# Patient Record
Sex: Female | Born: 1990 | Race: White | Hispanic: No | Marital: Single | Smoking: Never smoker
Health system: Southern US, Community
[De-identification: ages and names within clinical notes are randomized; demographics above are authoritative.]

## PROBLEM LIST (undated history)

## (undated) DIAGNOSIS — F909 Attention-deficit hyperactivity disorder, unspecified type: Secondary | ICD-10-CM

## (undated) DIAGNOSIS — F329 Major depressive disorder, single episode, unspecified: Secondary | ICD-10-CM

## (undated) DIAGNOSIS — F419 Anxiety disorder, unspecified: Secondary | ICD-10-CM

## (undated) DIAGNOSIS — D649 Anemia, unspecified: Secondary | ICD-10-CM

## (undated) DIAGNOSIS — F32A Depression, unspecified: Secondary | ICD-10-CM

## (undated) DIAGNOSIS — I82611 Acute embolism and thrombosis of superficial veins of right upper extremity: Secondary | ICD-10-CM

## (undated) DIAGNOSIS — F840 Autistic disorder: Secondary | ICD-10-CM

## (undated) DIAGNOSIS — R519 Headache, unspecified: Secondary | ICD-10-CM

## (undated) DIAGNOSIS — K219 Gastro-esophageal reflux disease without esophagitis: Secondary | ICD-10-CM

## (undated) DIAGNOSIS — G4730 Sleep apnea, unspecified: Secondary | ICD-10-CM

## (undated) DIAGNOSIS — K209 Esophagitis, unspecified without bleeding: Secondary | ICD-10-CM

## (undated) DIAGNOSIS — E079 Disorder of thyroid, unspecified: Secondary | ICD-10-CM

## (undated) DIAGNOSIS — E039 Hypothyroidism, unspecified: Secondary | ICD-10-CM

## (undated) DIAGNOSIS — J45909 Unspecified asthma, uncomplicated: Secondary | ICD-10-CM

## (undated) DIAGNOSIS — K5190 Ulcerative colitis, unspecified, without complications: Secondary | ICD-10-CM

## (undated) HISTORY — PX: KNEE ARTHROSCOPY: SUR90

## (undated) HISTORY — PX: KNEE SURGERY: SHX244

---
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
## 2011-01-26 ENCOUNTER — Encounter: Payer: Self-pay | Admitting: Emergency Medicine

## 2011-01-26 ENCOUNTER — Inpatient Hospital Stay (INDEPENDENT_AMBULATORY_CARE_PROVIDER_SITE_OTHER)
Admission: RE | Admit: 2011-01-26 | Discharge: 2011-01-26 | Disposition: A | Discharge status: Home / Self Care | Payer: Medicaid Other | Source: Ambulatory Visit | Attending: Emergency Medicine | Admitting: Emergency Medicine

## 2011-01-26 DIAGNOSIS — K5289 Other specified noninfective gastroenteritis and colitis: Secondary | ICD-10-CM

## 2011-01-26 DIAGNOSIS — R112 Nausea with vomiting, unspecified: Secondary | ICD-10-CM

## 2011-01-27 ENCOUNTER — Telehealth (INDEPENDENT_AMBULATORY_CARE_PROVIDER_SITE_OTHER): Payer: Self-pay

## 2011-01-28 ENCOUNTER — Telehealth (INDEPENDENT_AMBULATORY_CARE_PROVIDER_SITE_OTHER): Payer: Self-pay | Admitting: *Deleted

## 2011-02-03 ENCOUNTER — Inpatient Hospital Stay (INDEPENDENT_AMBULATORY_CARE_PROVIDER_SITE_OTHER)
Admission: RE | Admit: 2011-02-03 | Discharge: 2011-02-03 | Disposition: A | Discharge status: Home / Self Care | Payer: Medicaid Other | Source: Ambulatory Visit | Attending: Family Medicine | Admitting: Family Medicine

## 2011-02-03 ENCOUNTER — Encounter: Payer: Self-pay | Admitting: Family Medicine

## 2011-02-03 DIAGNOSIS — J301 Allergic rhinitis due to pollen: Secondary | ICD-10-CM

## 2011-02-03 DIAGNOSIS — M546 Pain in thoracic spine: Secondary | ICD-10-CM

## 2011-02-07 ENCOUNTER — Ambulatory Visit: Payer: Medicaid Other | Attending: Family Medicine | Admitting: Physical Therapy

## 2011-02-07 DIAGNOSIS — M546 Pain in thoracic spine: Secondary | ICD-10-CM | POA: Insufficient documentation

## 2011-02-07 DIAGNOSIS — IMO0001 Reserved for inherently not codable concepts without codable children: Secondary | ICD-10-CM | POA: Insufficient documentation

## 2011-02-07 DIAGNOSIS — M6281 Muscle weakness (generalized): Secondary | ICD-10-CM | POA: Insufficient documentation

## 2011-02-07 DIAGNOSIS — M2569 Stiffness of other specified joint, not elsewhere classified: Secondary | ICD-10-CM | POA: Insufficient documentation

## 2011-02-11 ENCOUNTER — Ambulatory Visit: Payer: Medicaid Other | Admitting: Physical Therapy

## 2011-02-14 ENCOUNTER — Ambulatory Visit: Payer: Medicaid Other | Admitting: Physical Therapy

## 2011-02-19 ENCOUNTER — Encounter: Payer: Medicaid Other | Admitting: Physical Therapy

## 2011-02-21 ENCOUNTER — Encounter: Payer: Medicaid Other | Admitting: Physical Therapy

## 2011-02-22 ENCOUNTER — Ambulatory Visit: Payer: Medicaid Other | Admitting: Physical Therapy

## 2011-03-01 ENCOUNTER — Ambulatory Visit: Payer: Medicaid Other | Attending: Family Medicine | Admitting: Physical Therapy

## 2011-03-01 DIAGNOSIS — M6281 Muscle weakness (generalized): Secondary | ICD-10-CM | POA: Insufficient documentation

## 2011-03-01 DIAGNOSIS — M546 Pain in thoracic spine: Secondary | ICD-10-CM | POA: Insufficient documentation

## 2011-03-01 DIAGNOSIS — M2569 Stiffness of other specified joint, not elsewhere classified: Secondary | ICD-10-CM | POA: Insufficient documentation

## 2011-03-01 DIAGNOSIS — IMO0001 Reserved for inherently not codable concepts without codable children: Secondary | ICD-10-CM | POA: Insufficient documentation

## 2011-03-02 ENCOUNTER — Inpatient Hospital Stay (INDEPENDENT_AMBULATORY_CARE_PROVIDER_SITE_OTHER)
Admission: RE | Admit: 2011-03-02 | Discharge: 2011-03-02 | Disposition: A | Discharge status: Home / Self Care | Payer: Medicaid Other | Source: Ambulatory Visit | Attending: Family Medicine | Admitting: Family Medicine

## 2011-03-02 ENCOUNTER — Encounter: Payer: Self-pay | Admitting: Family Medicine

## 2011-03-02 DIAGNOSIS — R197 Diarrhea, unspecified: Secondary | ICD-10-CM

## 2011-03-02 DIAGNOSIS — M940 Chondrocostal junction syndrome [Tietze]: Secondary | ICD-10-CM

## 2011-03-04 ENCOUNTER — Encounter: Payer: Self-pay | Admitting: Family Medicine

## 2011-03-07 ENCOUNTER — Telehealth (INDEPENDENT_AMBULATORY_CARE_PROVIDER_SITE_OTHER): Payer: Self-pay | Admitting: *Deleted

## 2011-03-08 ENCOUNTER — Other Ambulatory Visit: Payer: Self-pay | Admitting: Family Medicine

## 2011-03-08 ENCOUNTER — Ambulatory Visit
Admission: RE | Admit: 2011-03-08 | Discharge: 2011-03-08 | Disposition: A | Discharge status: Home / Self Care | Payer: Medicaid Other | Source: Ambulatory Visit | Attending: Family Medicine | Admitting: Family Medicine

## 2011-03-08 ENCOUNTER — Inpatient Hospital Stay (INDEPENDENT_AMBULATORY_CARE_PROVIDER_SITE_OTHER)
Admission: RE | Admit: 2011-03-08 | Discharge: 2011-03-08 | Disposition: A | Discharge status: Home / Self Care | Payer: Medicaid Other | Source: Ambulatory Visit | Attending: Family Medicine | Admitting: Family Medicine

## 2011-03-08 ENCOUNTER — Encounter: Payer: Self-pay | Admitting: Family Medicine

## 2011-03-08 DIAGNOSIS — S63509A Unspecified sprain of unspecified wrist, initial encounter: Secondary | ICD-10-CM

## 2011-03-08 DIAGNOSIS — M25539 Pain in unspecified wrist: Secondary | ICD-10-CM

## 2011-03-08 DIAGNOSIS — J029 Acute pharyngitis, unspecified: Secondary | ICD-10-CM

## 2011-03-08 DIAGNOSIS — M940 Chondrocostal junction syndrome [Tietze]: Secondary | ICD-10-CM

## 2011-03-08 DIAGNOSIS — H6690 Otitis media, unspecified, unspecified ear: Secondary | ICD-10-CM

## 2011-03-08 DIAGNOSIS — J069 Acute upper respiratory infection, unspecified: Secondary | ICD-10-CM

## 2011-03-10 ENCOUNTER — Encounter (INDEPENDENT_AMBULATORY_CARE_PROVIDER_SITE_OTHER): Payer: Self-pay

## 2011-03-12 ENCOUNTER — Telehealth (INDEPENDENT_AMBULATORY_CARE_PROVIDER_SITE_OTHER): Payer: Self-pay | Admitting: *Deleted

## 2011-03-20 DIAGNOSIS — E039 Hypothyroidism, unspecified: Secondary | ICD-10-CM | POA: Insufficient documentation

## 2011-03-20 DIAGNOSIS — D649 Anemia, unspecified: Secondary | ICD-10-CM | POA: Insufficient documentation

## 2011-03-22 ENCOUNTER — Inpatient Hospital Stay (INDEPENDENT_AMBULATORY_CARE_PROVIDER_SITE_OTHER)
Admission: RE | Admit: 2011-03-22 | Discharge: 2011-03-22 | Disposition: A | Discharge status: Home / Self Care | Payer: Medicaid Other | Source: Ambulatory Visit | Attending: Family Medicine | Admitting: Family Medicine

## 2011-03-22 ENCOUNTER — Ambulatory Visit
Admission: RE | Admit: 2011-03-22 | Discharge: 2011-03-22 | Disposition: A | Discharge status: Home / Self Care | Payer: Medicaid Other | Source: Ambulatory Visit | Attending: Family Medicine | Admitting: Family Medicine

## 2011-03-22 ENCOUNTER — Other Ambulatory Visit: Payer: Self-pay | Admitting: Family Medicine

## 2011-03-22 ENCOUNTER — Encounter: Payer: Self-pay | Admitting: Family Medicine

## 2011-03-22 DIAGNOSIS — M65839 Other synovitis and tenosynovitis, unspecified forearm: Secondary | ICD-10-CM

## 2011-03-22 DIAGNOSIS — M79609 Pain in unspecified limb: Secondary | ICD-10-CM

## 2011-03-22 DIAGNOSIS — M65849 Other synovitis and tenosynovitis, unspecified hand: Secondary | ICD-10-CM

## 2011-03-24 ENCOUNTER — Encounter: Payer: Self-pay | Admitting: Emergency Medicine

## 2011-03-24 ENCOUNTER — Inpatient Hospital Stay (INDEPENDENT_AMBULATORY_CARE_PROVIDER_SITE_OTHER)
Admission: RE | Admit: 2011-03-24 | Discharge: 2011-03-24 | Disposition: A | Discharge status: Home / Self Care | Payer: Medicaid Other | Source: Ambulatory Visit | Attending: Emergency Medicine | Admitting: Emergency Medicine

## 2011-03-24 DIAGNOSIS — M65839 Other synovitis and tenosynovitis, unspecified forearm: Secondary | ICD-10-CM

## 2011-03-27 ENCOUNTER — Telehealth (INDEPENDENT_AMBULATORY_CARE_PROVIDER_SITE_OTHER): Payer: Self-pay | Admitting: *Deleted

## 2011-04-30 DIAGNOSIS — K52832 Lymphocytic colitis: Secondary | ICD-10-CM | POA: Insufficient documentation

## 2011-07-29 NOTE — Telephone Encounter (Signed)
  Phone Note Call from Patient   Caller: Mom Summary of Call: spoke to pts mom she states that andrya is still having problems with her finger, she saw her PCP today and she refused to give her a referal to a orthopedic per pts mom. she also states that she has been vommiting off and on. Advised the mom to call her case worker tomorrow in regards to changing her PCP. Initial call taken by: Clemens Catholic LPN,  March 27, 2011 7:34 PM

## 2011-07-29 NOTE — Progress Notes (Signed)
Summary: R MIDDLE FINGER PAIN Room 5   Vital Signs:  Patient Profile:   20 Years Old Female CC:      Rt Middle finger pain x 5 yrs hurts worse today Height:     67 inches Weight:      273 pounds O2 Sat:      97 % O2 treatment:    Room Air Temp:     98.2 degrees F oral Pulse rate:   78 / minute Pulse rhythm:   regular Resp:     18 per minute BP sitting:   107 / 73  (left arm) Cuff size:   large  Vitals Entered By: Emilio Math (March 22, 2011 5:21 PM)                  Current Allergies (reviewed today): ! * PRETZEL ! * BEE STINGSHistory of Present Illness Chief Complaint: Rt Middle finger pain x 5 yrs hurts worse today History of Present Illness:  Subjective:  Patient complains of pain in her right 3rd finger for 2 to 3 days.  She has had no trauma to the finger, but admits that she constantly and nervously "cracks my nuckles."  She states that she has had intermittent pain in the finger for about 5 years.  Current Meds INDERAL LA 120 MG XR24H-CAP (PROPRANOLOL HCL) twice daily * GEODON 120 MG CAPS (ZIPRASIDONE HCL) twice daily * SINGULAIR at bedtime * ZYRTEC daily ZOFRAN ODT 4 MG TBDP (ONDANSETRON) 1 dissolvable tab q6 hrs as needed for nausea * DOSTINEX 0.5 MG Mon/Wed/Friday * DEXITANT VIT D2 once a week on Monday  REVIEW OF SYSTEMS Constitutional Symptoms      Denies fever, chills, night sweats, weight loss, weight gain, and fatigue.  Eyes       Denies change in vision, eye pain, eye discharge, glasses, contact lenses, and eye surgery. Ear/Nose/Throat/Mouth       Denies hearing loss/aids, change in hearing, ear pain, ear discharge, dizziness, frequent runny nose, frequent nose bleeds, sinus problems, sore throat, hoarseness, and tooth pain or bleeding.  Respiratory       Denies dry cough, productive cough, wheezing, shortness of breath, asthma, bronchitis, and emphysema/COPD.  Cardiovascular       Denies murmurs, chest pain, and tires easily with exhertion.     Gastrointestinal       Denies stomach pain, nausea/vomiting, diarrhea, constipation, blood in bowel movements, and indigestion. Genitourniary       Denies painful urination, kidney stones, and loss of urinary control. Neurological       Denies paralysis, seizures, and fainting/blackouts. Musculoskeletal       Complains of joint pain, joint stiffness, and decreased range of motion.      Denies muscle pain, redness, swelling, muscle weakness, and gout.  Skin       Denies bruising, unusual mles/lumps or sores, and hair/skin or nail changes.  Psych       Denies mood changes, temper/anger issues, anxiety/stress, speech problems, depression, and sleep problems.  Past History:  Past Medical History: AUTISM ADHD EXPLOSIVE BEHAVIOR Alltices  Past Surgical History: Reviewed history from 02/03/2011 and no changes required. 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 GRANDFATER- CA,DM Family History High cholesterol-Mother Bi-polar1&2-Mother  Social History: Reviewed history from 03/02/2011 and no changes required. SMOKE-NO ALCOHOL-NO REC. DRUGS-NO Lives with Mom   Objective:  Right third finger:  Patient is holding finger in slight hyperextension at the PIP joint.  Mild swelling at the PIP joint.  Finger has full range of motion.  Mild tenderness at PIP and DIP joints.  Distal neurovascular intact  X-ray right third finger:  Negative Assessment New Problems: OTHER TENOSYNOVITIS OF HAND AND WRIST (ICD-727.05) FINGER PAIN (ICD-729.5)  MILD TENDONTITIS RIGHT THIRD FINGER FROM EXCESSIVE MANIPULATION OF JOINTS.   WILL TREAT AS A SPRAIN  Plan New Orders: T-DG Finger Middle*R* [73140] Application finger Splint [29130] Upper Limb Ortosis (wrist loop, arm band, finger splint) [L3999] Services provided After hours-Weekends-Holidays [99051] Est. Patient Level III [04540] Planning Comments:   Splint applied with finger  in mild flexion; wear for about 5 days.  Apply ice pack several times daily.  Begin Ibuprofen. In about 5 days begin range of motion exercises ([employer] information and instruction patient handout given for finger sprain)  Advised to avoid "cracking nuckles" Followup with [facility] if not improved in two weeks.   The patient and/or caregiver has been counseled thoroughly with regard to medications prescribed including dosage, schedule, interactions, rationale for use, and possible side effects and they verbalize understanding.  Diagnoses and expected course of recovery discussed and will return if not improved as expected or if the condition worsens. Patient and/or caregiver verbalized understanding.   Orders Added: 1)  T-DG Finger Middle*R* [73140] 2)  Application finger Splint [29130] 3)  Upper Limb Ortosis (wrist loop, arm band, finger splint) [L3999] 4)  Services provided After hours-Weekends-Holidays [99051] 5)  Est. Patient Level III [98119]

## 2011-07-29 NOTE — Telephone Encounter (Signed)
  Phone Note Call from Patient   Caller: Patient mother [name] Summary of Call: Patient's mother called requesting throat culture result. Negative result given. She also inquired about drinking milk. Per Dr. Rolla Plate note from 03/08/11 he recommends she still stay away from milk products. I relayed this information to Chara's mother.  Initial call taken by: Lajean Saver RN,  March 12, 2011 2:42 PM

## 2011-07-29 NOTE — Telephone Encounter (Signed)
  Phone Note Outgoing Call Call back at [phone]   Call placed by: Lajean Saver RN,  March 07, 2011 2:27 PM Call placed to: Patient & patient mother Action Taken: Phone Call Completed Summary of Call: Callback: Patients mother reports Jasmine Hickman is still having some diarrhea. She is being seen @ her PCP office today for ear infections. O &P and lactoferrin result given to mother. Told her stool cutlure is still pending. Dr. Cathren Harsh will call her tomorrow if further testing or medications need to be ordered  when results come back.  Initial call taken by: Lajean Saver RN,  March 07, 2011 2:29 PM

## 2011-07-29 NOTE — Progress Notes (Signed)
Summary: DIARRHEA (room 2)   Vital Signs:  Patient Profile:   20 Years Old Female CC:      diarrhea Height:     67 inches Weight:      275 pounds O2 Sat:      100 % O2 treatment:    Room Air Temp:     97.8 degrees F oral Pulse rate:   67 / minute Resp:     16 per minute BP sitting:   107 / 72  (left arm) Cuff size:   large  Pt. in pain?   no  Vitals Entered By: Lavell Islam RN (March 02, 2011 12:09 PM)                   Current Allergies (reviewed today): ! * PRETZEL ! * BEE STINGSHistory of Present Illness Chief Complaint: diarrhea History of Present Illness:  Subjective:  Mom reports that Taleshia has had diarrhea for about 3 weeks.  There was some blood in her stool initially, now resolved, but diarrhea persists (about 3 to 4 stools per day).  No abdominal pain.  No fever.  No recent foreign travel or drinking untreated water.  No recent antibiotics.  Mom reports that Alexya drinks large amounts of milk.  She also complains of chest pain recently.  REVIEW OF SYSTEMS Constitutional Symptoms       Complains of fever.     Denies chills, night sweats, weight loss, weight gain, and fatigue.  Eyes       Denies change in vision, eye pain, eye discharge, glasses, contact lenses, and eye surgery. Ear/Nose/Throat/Mouth       Complains of ear pain.      Denies hearing loss/aids, change in hearing, ear discharge, dizziness, frequent runny nose, frequent nose bleeds, sinus problems, sore throat, hoarseness, and tooth pain or bleeding.  Respiratory       Denies dry cough, productive cough, wheezing, shortness of breath, asthma, bronchitis, and emphysema/COPD.  Cardiovascular       Denies murmurs, chest pain, and tires easily with exhertion.    Gastrointestinal       Complains of diarrhea.      Denies stomach pain, nausea/vomiting, constipation, blood in bowel movements, and indigestion. Genitourniary       Denies painful urination, kidney stones, and loss of urinary  control. Neurological       Denies paralysis, seizures, and fainting/blackouts. Musculoskeletal       Denies muscle pain, joint pain, joint stiffness, decreased range of motion, redness, swelling, muscle weakness, and gout.  Skin       Denies bruising, unusual mles/lumps or sores, and hair/skin or nail changes.  Psych       Denies mood changes, temper/anger issues, anxiety/stress, speech problems, depression, and sleep problems. Other Comments: diarrhea    Past History:  Past Medical History: Reviewed history from 01/26/2011 and no changes required. AUTISM ADHD EXPLOSIVE BEHAVIOR  Family History: Reviewed history from 01/26/2011 and no changes required. GRANDFATER- CA,DM Family History High cholesterol Bi-polar  Social History: Reviewed history from 01/26/2011 and no changes required. SMOKE-NO ALCOHOL-NO REC. DRUGS-NO Lives with Mom   Objective:  No acute distress.  She appears comfortable and alert. Eyes:  Pupils are equal, round, and reactive to light and accomodation.  Extraocular movement is intact.  Conjunctivae are not inflamed.  Mouth/pharynx:  normal Neck:  Supple.  No adenopathy is present.  No thyromegaly is present  Lungs:  Clear to auscultation.  Breath sounds are  equal.  Chest:  Distinct tenderness over the sternum Heart:  Regular rate and rhythm without murmurs, rubs, or gallops.  Abdomen:  Nontender without masses or hepatosplenomegaly.  Bowel sounds are present.  No CVA or flank tenderness.  Extremities:  No edema.  No calf tenderness Skin:  No rash EKG:  No acute changes. Assessment New Problems: COSTOCHONDRITIS, ACUTE (ICD-733.6) DIARRHEA (ICD-787.91)  ? LACTOSE INTOLERANCE  Plan New Orders: Services provided After hours-Weekends-Holidays [99051] T-Culture, Stool [[id number]] T-Stool for O&P [87177-70555] Stool, WBC/Lactoferrin-[facility] [83630] Est. Patient Level IV [21308] Planning Comments:   Recommend Ibuprofen 200mg , 4 tabs every 8  hours with food for chest tenderness.  Apply heat pad to chest several times daily.  Given a Water quality scientist patient information and instruction sheet on topic costochondritis Requested to return stool specimens for culture, exam for O & P, and WBC/lactoferrin. Recommend that she discontinue milk products for about 2 to 3 weeks.  If symptoms persist, recommend GI evaluation.   The patient and/or caregiver has been counseled thoroughly with regard to medications prescribed including dosage, schedule, interactions, rationale for use, and possible side effects and they verbalize understanding.  Diagnoses and expected course of recovery discussed and will return if not improved as expected or if the condition worsens. Patient and/or caregiver verbalized understanding.   Orders Added: 1)  Services provided After hours-Weekends-Holidays [99051] 2)  T-Culture, Stool [[id number]] 3)  T-Stool for O&P [[id number]] 4)  Stool, WBC/Lactoferrin-[facility] [83630] 5)  Est. Patient Level IV [65784]

## 2011-07-29 NOTE — Progress Notes (Signed)
Summary: sore throat   Vital Signs:  Patient Profile:   20 Years Old Female CC:      bILATERAL EAR PAIN; SORE THROAT;  LEFT WRIST HURTS Height:     67 inches Weight:      274.75 pounds O2 Sat:      97 % O2 treatment:    Room Air Temp:     98.1 degrees F oral Pulse rate:   85 / minute Resp:     16 per minute BP sitting:   105 / 73  (left arm) Cuff size:   large  Vitals Entered By: Lavell Islam RN (March 08, 2011 3:24 PM)                  Updated Prior Medication List: INDERAL LA 120 MG XR24H-CAP (PROPRANOLOL HCL) twice daily * GEODON 120 MG CAPS (ZIPRASIDONE HCL) twice daily * SINGULAIR at bedtime * ZYRTEC daily ZOFRAN ODT 4 MG TBDP (ONDANSETRON) 1 dissolvable tab q6 hrs as needed for nausea * DOSTINEX 0.5 MG Mon/Wed/Friday * DEXITANT VIT D2 once a week on Monday  Current Allergies (reviewed today): ! * PRETZEL ! * BEE STINGSHistory of Present Illness Chief Complaint: bILATERAL EAR PAIN; SORE THROAT;  LEFT WRIST HURTS History of Present Illness:  Subjective: Patient presents with two problems: 1)  She has had URI symptoms that started yesterday. 2)  She hyperflexed her left wrist on bathtub this morning and felt a popping sensation.  She has had persistent pain in left wrist today. + sore throat + cough No pleuritic pain but continues to have anterior chest discomfort No wheezing + nasal congestion ? post-nasal drainage No sinus pain/pressure No itchy/red eyes + earache No hemoptysis No SOB No fever/chills No nausea No vomiting No abdominal pain No diarrhea No skin rashes + fatigue No myalgias No headache Used OTC meds without relief   REVIEW OF SYSTEMS Constitutional Symptoms       Complains of fever.     Denies chills, night sweats, weight loss, weight gain, and fatigue.  Eyes       Denies change in vision, eye pain, eye discharge, glasses, contact lenses, and eye surgery. Ear/Nose/Throat/Mouth       Complains of ear pain, frequent runny nose,  sinus problems, and sore throat.      Denies hearing loss/aids, change in hearing, ear discharge, dizziness, frequent nose bleeds, hoarseness, and tooth pain or bleeding.  Respiratory       Complains of dry cough.      Denies productive cough, wheezing, shortness of breath, asthma, bronchitis, and emphysema/COPD.  Cardiovascular       Denies murmurs, chest pain, and tires easily with exhertion.    Gastrointestinal       Complains of diarrhea.      Denies stomach pain, nausea/vomiting, constipation, blood in bowel movements, and indigestion. Genitourniary       Complains of loss of urinary control.      Denies painful urination and kidney stones. Neurological       Complains of headaches, numbness, and tingling.      Denies paralysis, seizures, and fainting/blackouts. Musculoskeletal       Complains of joint pain and joint stiffness.      Denies muscle pain, decreased range of motion, redness, swelling, muscle weakness, and gout.  Skin       Denies bruising, unusual mles/lumps or sores, and hair/skin or nail changes.  Psych       Denies mood changes, temper/anger  issues, anxiety/stress, speech problems, depression, and sleep problems. Other Comments: URI S&S; sore throat; continued diarrhea/GI; left wrist pain   Past History:  Family History: Last updated: 03/02/2011 [facility]- [place],DM Family History High cholesterol Bi-polar  Social History: Last updated: 03/02/2011 SMOKE-NO ALCOHOL-NO REC. DRUGS-NO Lives with Mom  Past Medical History: Reviewed history from 01/26/2011 and no changes required. AUTISM ADHD EXPLOSIVE BEHAVIOR  Past Surgical History: Reviewed history from 02/03/2011 and no changes required. 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 GRANDFATER- CA,DM Family History High cholesterol Bi-polar  Social History: Reviewed history from 03/02/2011 and no changes required. SMOKE-NO ALCOHOL-NO REC.  DRUGS-NO Lives with Mom   Objective:  No acute distress  Eyes:  Pupils are equal, round, and reactive to light and accomodation.  Extraocular movement is intact.  Conjunctivae are not inflamed.  Ears:  Canals normal.  Tympanic membranes are erythematous bilaterally, worse on the left Nose:  Mildly congested turbinates.  No sinus tenderness  Pharynx:  Erythematous Neck:  Supple.  No adenopathy is present.   Lungs:  Clear to auscultation.  Breath sounds are equal.  Chest:  Tenderness over sternum Heart:  Regular rate and rhythm without murmurs, rubs, or gallops.  Abdomen:  Nontender without masses or hepatosplenomegaly.  Bowel sounds are present.  No CVA or flank tenderness.  Extremities:  No edema.   Skin:  No rash Left wrist:  No swelling or deformity.  Good range of motion.  Tender dorsally over distal radius/ulna.  Distal neurovascular intact. X-ray left wrist:  Negative Rapid strep test negative  Assessment  Assessed COSTOCHONDRITIS, ACUTE as unchanged - Donna Christen MD New Problems: WRIST SPRAIN, LEFT (ICD-842.00) WRIST PAIN, LEFT (ICD-719.43) ACUTE PHARYNGITIS (ICD-462) OTITIS MEDIA, ACUTE, BILATERAL (ICD-382.9) UPPER RESPIRATORY INFECTION, ACUTE (ICD-465.9)  NEW ONSET VIRAL URI WITH BILATERAL OTITIS MEDIA.  Plan New Medications/Changes: BENZONATATE 200 MG CAPS (BENZONATATE) One by mouth hs as needed cough  #12 x 0, 03/08/2011, Donna Christen MD AMOXICILLIN 875 MG TABS (AMOXICILLIN) One by mouth two times a day  #20 x 0, 03/08/2011, Donna Christen MD  New Orders: T-Culture, Throat [[id number]] T-DG Wrist Complete*L* [73110] Rapid Strep [[address]] Pulse Oximetry [94760] Wrist & Forearm Splint any size [L3984] Est. Patient Level V [91478] Planning Comments:   Begin amoxicillin, expectorant, topical decongestant, cough suppressant at bedtime.  Increase fluid intake Throat culture pending. Dispensed wrist splint, apply ice pack to wrist.  Begin wrist exercises in 3 to 5  days ([employer] information and instruction patient handout given).  Continue heat pad to sternum several times dailyl. Followup with PCP if not improving 7 to 10 days   The patient and/or caregiver has been counseled thoroughly with regard to medications prescribed including dosage, schedule, interactions, rationale for use, and possible side effects and they verbalize understanding.  Diagnoses and expected course of recovery discussed and will return if not improved as expected or if the condition worsens. Patient and/or caregiver verbalized understanding.  Prescriptions: BENZONATATE 200 MG CAPS (BENZONATATE) One by mouth hs as needed cough  #12 x 0   Entered and Authorized by:   Donna Christen MD   Signed by:   Donna Christen MD on 03/08/2011   Method used:   Print then Give to Patient   RxID:   [id number] AMOXICILLIN 875 MG TABS (AMOXICILLIN) One by mouth two times a day  #20 x 0   Entered and Authorized by:   Donna Christen MD   Signed by:   Jeannett Senior  Beese MD on 03/08/2011   Method used:   Print then Give to Patient   RxID:   [id number]   Patient Instructions: 1)  Take Mucinex (guaifenesin) twice daily for congestion. 2)  Increase fluid intake, rest. 3)  May use Afrin nasal spray (or generic oxymetazoline) twice daily for about 5 days.  Also recommend using saline nasal spray several times daily and/or saline nasal irrigation. 4)  Stop Zyrtec for now. 5)  Followup with family doctor if not improving 7 to 10 days.   Orders Added: 1)  T-Culture, Throat [[id number]] 2)  T-DG Wrist Complete*L* [73110] 3)  Rapid Strep [87880] 4)  Pulse Oximetry [94760] 5)  Wrist & Forearm Splint any size [L3984] 6)  Est. Patient Level V [84132]  Appended Document: sore throat Rapid Strep: Negative

## 2011-07-29 NOTE — Telephone Encounter (Signed)
  Phone Note Outgoing Call   Call placed by: Linton Flemings RN,  January 27, 2011 12:32 PM Call placed to: Patient Summary of Call: message left informing that we were calling to follow up with status and to call facility with questions/concerns

## 2011-07-29 NOTE — Progress Notes (Signed)
Summary: pt reuest ear medication  pt. family called and states that pt is unable to sleep d/t ear pain. pt has been given ABT but they are requesting ear gtts for pain.  Lucky Cowboy Yetta Barre RN  March 10, 2011 3:19 PM  pt request to have medication sent [facility] [address] in [facility] RN  March 10, 2011 3:21 PM  Plan:  ANTIPYRINE-BENZOCAINE 5.4-1.4 % SOLN (BENZOCAINE-ANTIPYRINE) 2 to 4 gtts in affected ear three times a day for 2 to 3 days as needed ear pain Donna Christen MD  March 10, 2011 3:27 PM   Prescriptions: ANTIPYRINE-BENZOCAINE 5.4-1.4 % SOLN (BENZOCAINE-ANTIPYRINE) 2 to 4 gtts in affected ear three times a day for 2 to 3 days as needed ear pain  #10cc x 0   Entered and Authorized by:   Donna Christen MD   Signed by:   Donna Christen MD on 03/10/2011   Method used:   [facility] to        [facility] [phone][facility] (retail)       [address] [place], [place]  [address]       Ph: [phone] or [phone]       Fax: [phone]   RxID:   [id number]  Called Patrica family member and informed her that medication was sent electronically. Lucky Cowboy Yetta Barre RN  March 10, 2011 3:32 PM

## 2011-07-29 NOTE — Telephone Encounter (Signed)
  Phone Note Outgoing Call Call back at [phone]   Call placed by: Lajean Saver RN,  January 28, 2011 8:38 AM Call placed to: Patient mother Summary of Call: Received message from mother from Sunday that Jasmine Hickman was still vomiting after attempting to several different foods. Diarrhea has stopped but vomitting, dizzy and HA. I returned the call but got her mother's voicemail. Left a messgae reporting Dr. Donnamarie Poag note for rest, hydration, BLAND diet and if worsening then will needs labs and further testing @ the ER. Call back with questions. Initial call taken by: Lajean Saver RN,  January 28, 2011 8:43 AM    Lajean Saver RN  January 28, 2011 8:43 AM

## 2011-07-29 NOTE — Progress Notes (Signed)
Summary: F/U splint on finger/TM(RM5)   Vital Signs:  Patient Profile:   20 Years Old Female CC:      RE- SPLINT FINGER Height:     67 inches Weight:      272 pounds O2 Sat:      96 % Temp:     98.1 degrees F oral Pulse rate:   79 / minute Resp:     16 per minute BP sitting:   105 / 74  Vitals Entered By: Linton Flemings RN (March 24, 2011 4:41 PM)                  Updated Prior Medication List: INDERAL LA 120 MG XR24H-CAP (PROPRANOLOL HCL) twice daily * GEODON 120 MG CAPS (ZIPRASIDONE HCL) twice daily * SINGULAIR at bedtime * ZYRTEC daily ZOFRAN ODT 4 MG TBDP (ONDANSETRON) 1 dissolvable tab q6 hrs as needed for nausea * DOSTINEX 0.5 MG Mon/Wed/Friday * DEXITANT VIT D2 once a week on Monday  Current Allergies (reviewed today): ! * PRETZEL ! * BEE STINGSHistory of Present Illness Chief Complaint: RE- SPLINT FINGER History of Present Illness: Nursing visit due to callous on finger and need for a new splint since the current one is aggravating her finger.  Seen by nurse, not the physician.  REVIEW OF SYSTEMS Constitutional Symptoms      Denies fever, chills, night sweats, weight loss, weight gain, and fatigue.  Eyes       Denies change in vision, eye pain, eye discharge, glasses, contact lenses, and eye surgery. Ear/Nose/Throat/Mouth       Denies hearing loss/aids, change in hearing, ear pain, ear discharge, dizziness, frequent runny nose, frequent nose bleeds, sinus problems, sore throat, hoarseness, and tooth pain or bleeding.  Respiratory       Denies dry cough, productive cough, wheezing, shortness of breath, asthma, bronchitis, and emphysema/COPD.  Cardiovascular       Denies murmurs, chest pain, and tires easily with exhertion.    Gastrointestinal       Denies stomach pain, nausea/vomiting, diarrhea, constipation, blood in bowel movements, and indigestion. Genitourniary       Denies painful urination, kidney stones, and loss of urinary control. Neurological   Denies paralysis, seizures, and fainting/blackouts. Musculoskeletal       Denies muscle pain, joint pain, joint stiffness, decreased range of motion, redness, swelling, muscle weakness, and gout.  Skin       Denies bruising, unusual mles/lumps or sores, and hair/skin or nail changes.  Psych       Denies mood changes, temper/anger issues, anxiety/stress, speech problems, depression, and sleep problems. Other Comments: SMALL CALLOUS ON FINGER, STATES SPLINT WAS TO TIGHT. RE- SPLINT FINGER PT STATES IT FEELS BETTER   Past History:  Past Medical History: Reviewed history from 03/22/2011 and no changes required. AUTISM ADHD EXPLOSIVE BEHAVIOR Alltices  Past Surgical History: Reviewed history from 02/03/2011 and no changes required. 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 GRANDFATER- CA,DM Family History High cholesterol-Mother Bi-polar1&2-Mother  Social History: Reviewed history from 03/02/2011 and no changes required. SMOKE-NO ALCOHOL-NO REC. DRUGS-NO Lives with Mom  Plan New Orders: No Charge Patient Arrived (NCPA0) [NCPA0] Planning Comments:   see previous note for details.  new stack splint given which the patient prefers over the other one.   The patient and/or caregiver has been counseled thoroughly with regard to medications prescribed including dosage, schedule, interactions, rationale for use, and possible side effects and they verbalize understanding.  Diagnoses and expected course of recovery discussed and will return if not improved as expected or if the condition worsens. Patient and/or caregiver verbalized understanding.   Orders Added: 1)  No Charge Patient Arrived (NCPA0) [NCPA0]

## 2011-07-29 NOTE — Progress Notes (Signed)
Summary: back pain/TM (room 4)   Vital Signs:  Patient Profile:   20 Years Old Female CC:      Back Pain x 6 days Height:     67 inches Weight:      272 pounds O2 Sat:      98 % O2 treatment:    Room Air Temp:     98.5 degrees F oral Pulse rate:   102 / minute Resp:     18 per minute BP sitting:   97 / 68  (right arm) Cuff size:   large  Pt. in pain?   yes    Location:   back  Vitals Entered By: Lavell Islam RN (February 03, 2011 3:20 PM)                   Prior Medication List:  INDERAL LA 120 MG XR24H-CAP (PROPRANOLOL HCL) twice daily * GEODON 120 MG CAPS (ZIPRASIDONE HCL) twice daily * SINGULAIR at bedtime * ZYRTEC daily ZOFRAN ODT 4 MG TBDP (ONDANSETRON) 1 dissolvable tab q6 hrs as needed for nausea   Current Allergies (reviewed today): ! * PRETZEL ! * BEE STINGSHistory of Present Illness Chief Complaint: Back Pain x 6 days History of Present Illness:  Subjective:  Patient complains of one week history of vague pain in upper back around shoulder blades,  now radiating somewhat to lower back.  Pain is worse with activity and movement.  No cough or shortness of breath.  She recalls no injury or recent change in activities. She also complains of soreness beneath her left eye.  Her mother states that she has seasonal allergies but has not taken her allergy meds recently.  No sore throat or earache.  REVIEW OF SYSTEMS Constitutional Symptoms       Complains of fever, chills, and night sweats.     Denies weight loss, weight gain, and fatigue.  Eyes       Complains of eye pain.      Denies change in vision, eye discharge, glasses, contact lenses, and eye surgery. Ear/Nose/Throat/Mouth       Complains of ear pain and sinus problems.      Denies hearing loss/aids, change in hearing, ear discharge, dizziness, frequent runny nose, frequent nose bleeds, sore throat, hoarseness, and tooth pain or bleeding.  Respiratory       Complains of shortness of breath.      Denies dry  cough, productive cough, wheezing, asthma, bronchitis, and emphysema/COPD.  Cardiovascular       Complains of chest pain.      Denies murmurs and tires easily with exhertion.    Gastrointestinal       Complains of nausea/vomiting and constipation.      Denies stomach pain, diarrhea, blood in bowel movements, and indigestion. Genitourniary       Complains of loss of urinary control.      Denies painful urination and kidney stones. Neurological       Denies paralysis, seizures, and fainting/blackouts. Musculoskeletal       Denies muscle pain, joint pain, joint stiffness, decreased range of motion, redness, swelling, muscle weakness, and gout.  Skin       Complains of bruising.      Denies unusual mles/lumps or sores and hair/skin or nail changes.      Comments: easily Psych       Complains of depression.      Denies mood changes, temper/anger issues, anxiety/stress, speech problems, and sleep problems. Other  Comments: Here for back pain x 6 days   Past History:  Family History: Last updated: 01/26/2011 [facility]- [place],DM  Social History: Last updated: 01/26/2011 SMOKE-NO ALCOHOL-NO REC. DRUGS-NO  Past Medical History: Reviewed history from 01/26/2011 and no changes required. 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 GRANDFATER- CA,DM  Social History: Reviewed history from 01/26/2011 and no changes required. SMOKE-NO ALCOHOL-NO REC. DRUGS-NO   Objective:  Appearance:  Patient is obese, otherwise in no acute distress  Eyes:  Pupils are equal, round, and reactive to light and accomodation.  Extraocular movement is intact.  Conjunctivae are not inflamed.  Ears:  Canals normal.  Tympanic membranes normal.   Nose:  Mildly congested turbinates.  Mild tenderness over left maxillary sinus Pharynx:  Normal  Neck:  Supple.  No adenopathy is present.  No  thyromegaly is present  Lungs:  Clear to auscultation.  Breath sounds are equal.  Heart:  Regular rate and rhythm without murmurs, rubs, or gallops.  Abdomen:  Nontender without masses or hepatosplenomegaly.  Bowel sounds are present.  No CVA or flank tenderness.  Back:  Distinct tenderness along medial and inferior edges of both scapula.  Pain is accentuated by resisted abduction of the shoulders.  Minimal tenderness below the scapulae. Skin:  No rash. Assessment New Problems: ALLERGIC RHINITIS, SEASONAL (ICD-477.0) BACK PAIN, THORACIC REGION (ICD-724.1)  BILATERAL RHOMBOID MUSCLE INFLAMMATION. MILD SINUS CONGESTION  Plan New Orders: Physical Therapy Referral [PT] Est. Patient Level IV [11914] Services provided After hours-Weekends-Holidays [99051] Planning Comments:   Apply heating pad to tender areas.  May take Tylenol at bedtime for pain.  Begin range of motion and stretching exercises for rhomboid muscles ([employer] information and instruction patient handout given).  Refer to PT Recommend resuming allergy meds, including fluticasone nasal spray.  Begin Mucinex and increased fluids.   The patient and/or caregiver has been counseled thoroughly with regard to medications prescribed including dosage, schedule, interactions, rationale for use, and possible side effects and they verbalize understanding.  Diagnoses and expected course of recovery discussed and will return if not improved as expected or if the condition worsens. Patient and/or caregiver verbalized understanding.   Orders Added: 1)  Physical Therapy Referral [PT] 2)  Est. Patient Level IV [78295] 3)  Services provided After hours-Weekends-Holidays [99051]

## 2011-07-29 NOTE — Progress Notes (Signed)
Summary: Followup Call  Followup call to mom.  She states that Jasmine Hickman was improving while off milk, but had a milkshake at her grandparents, and diarrhea recurred.  She has had no fever or blood in stool. Discussed lab results (negative cultures, O & P, and positive fecal lactoferrin).  Recommend that she remain off milk products, and follow-up with a gastroenterologist (may call us for a referral). Donna Christen MD  March 08, 2011 9:51 AM

## 2011-07-29 NOTE — Progress Notes (Signed)
Summary: STOMACH ISSUES(RM3)   Vital Signs:  Patient Profile:   20 Years Old Female CC:      upset stoamch Height:     67 inches Weight:      272 pounds O2 Sat:      97 % O2 treatment:    Room Air Temp:     97.4 degrees F oral Pulse rate:   84 / minute Resp:     20 per minute BP supine:   113 / 80  (left arm) BP sitting:   106 / 81  (left arm) BP standing:   113 / 84  (left arm) Cuff size:   large  Vitals Entered By: Linton Flemings RN (January 26, 2011 1:27 PM)                  Updated Prior Medication List: INDERAL LA 120 MG XR24H-CAP (PROPRANOLOL HCL) twice daily * GEODON 120 MG CAPS (ZIPRASIDONE HCL) twice daily * SINGULAIR at bedtime * ZYRTEC daily  Current Allergies: ! * PRETZEL ! * BEE STINGSHistory of Present Illness History from: patient Chief Complaint: upset stoamch History of Present Illness: N/V/D for 2 weeks, not improving.  She went to ER 2 days ago, they told her to take Imodium.  Her D is improving, but still with N/V and some P.O intolerance.  Generalized abdominal pain as well, HA, fatigue, and malaise.  +Sick contacts.  She tried to eat an egg sandwich this morning but threw it up.  REVIEW OF SYSTEMS Constitutional Symptoms       Complains of fever.     Denies chills, night sweats, weight loss, weight gain, and fatigue.  Eyes       Complains of change in vision and glasses.      Denies eye pain, eye discharge, contact lenses, and eye surgery. Ear/Nose/Throat/Mouth       Complains of ear pain.      Denies hearing loss/aids, change in hearing, ear discharge, dizziness, frequent runny nose, frequent nose bleeds, sinus problems, sore throat, hoarseness, and tooth pain or bleeding.  Respiratory       Denies dry cough, productive cough, wheezing, shortness of breath, asthma, bronchitis, and emphysema/COPD.  Cardiovascular       Denies murmurs, chest pain, and tires easily with exhertion.    Gastrointestinal       Denies stomach pain, nausea/vomiting,  diarrhea, constipation, blood in bowel movements, and indigestion. Genitourniary       Denies painful urination, kidney stones, and loss of urinary control. Neurological       Denies paralysis, seizures, and fainting/blackouts. Musculoskeletal       Denies muscle pain, joint pain, joint stiffness, decreased range of motion, redness, swelling, muscle weakness, and gout.  Skin       Denies bruising, unusual mles/lumps or sores, and hair/skin or nail changes.  Psych       Denies mood changes, temper/anger issues, anxiety/stress, speech problems, depression, and sleep problems. Other Comments: PT STATES SHE HAS BEEN SICK ON HER STOMACH SINCE LAST WEEK   Past History:  Past Medical History: AUTISM ADHD EXPLOSIVE BEHAVIOR  Family History: GRANDFATER- CA,DM  Social History: SMOKE-NO ALCOHOL-NO REC. DRUGS-NO Physical Exam General appearance: well developed, well nourished, no acute distress Ears: normal, no lesions or deformities Nasal: mucosa pink, nonedematous, no septal deviation, turbinates normal Oral/Pharynx: tongue normal, posterior pharynx without erythema or exudate Chest/Lungs: no rales, wheezes, or rhonchi bilateral, breath sounds equal without effort Heart: regular rate and  rhythm, no murmur Abdomen: mild diffuse tenderness, abdomen soft without obvious organomegaly Skin: no obvious rashes or lesions MSE: oriented to time, place, and person Assessment New Problems: NAUSEA AND VOMITING (ICD-787.01) GASTROENTERITIS (ICD-558.9)  Patient vomited  ~200cc after approx 20 mins of IVF (this was after 3x episodes).  Then gave her IM Phenergan and continued the IVF.  Upon leaving, she seems like she is more energetic and feels better.    Plan New Medications/Changes: ZOFRAN ODT 4 MG TBDP (ONDANSETRON) 1 dissolvable tab q6 hrs as needed for nausea  #16 x 0, 01/26/2011, Hoyt Koch MD  New Orders: New Patient Level IV [phone] Zofran 4mg  Tab [EMRORAL] IV Fluids 1st  hr.- Non Medicare [96360] Promethazine up to 50mg  [J2550] Admin of Therapeutic Inj  intramuscular or subcutaneous [96372] Planning Comments:   Gave 1L IVF and P.O Zofran.  Then IM Phenergan.  Rx given for ODT Zofran tabs to take over the next few days.  Educated on bland diet, and gradual resumption of regular diet over 3-5 days.  If further issues, should follow up with your PCP or at the ER if much worse, unable to keep any fluids/foods down.  LIkely just a viral gastroenteritis and resultant mild dehydration secondary to her learning difficulties and inability to fully comprehend the need for bland diet, smaller portions, etc.  Mother is present, understands, and agrees.  I advise that she go home, rest, eat small portions of a bland diet.  If still not able to keep stuff down or further problems, will need labwork at the ER as well as probably further evaluation.   The patient and/or caregiver has been counseled thoroughly with regard to medications prescribed including dosage, schedule, interactions, rationale for use, and possible side effects and they verbalize understanding.  Diagnoses and expected course of recovery discussed and will return if not improved as expected or if the condition worsens. Patient and/or caregiver verbalized understanding.  Prescriptions: ZOFRAN ODT 4 MG TBDP (ONDANSETRON) 1 dissolvable tab q6 hrs as needed for nausea  #16 x 0   Entered and Authorized by:   Hoyt Koch MD   Signed by:   Hoyt Koch MD on 01/26/2011   Method used:   Print then Give to Patient   RxID:   [id number]   Medication Administration  Injection # 1:    Medication: Promethazine up to 50mg     Diagnosis: NAUSEA AND VOMITING (ICD-787.01)    Route: IM    Site: RUOQ gluteus    Exp Date: 07/26/2013    Lot #: [id number]    Mfr: [place]    Comments: 25mg  IM    Patient tolerated injection without complications    Given by: Linton Flemings RN (January 26, 2011 3:20 PM)  Orders  Added: 1)  New Patient Level IV [99204] 2)  Zofran 4mg  Tab [EMRORAL] 3)  IV Fluids 1st hr.- Non Medicare [96360] 4)  Promethazine up to 50mg  [J2550] 5)  Admin of Therapeutic Inj  intramuscular or subcutaneous [96372]     Appended Document: STOMACH ISSUES(RM3) IV to left AC with 20g needle, infused of NS. w/out redness or infiltration noted. d/c IV  after liter of NS, cath intact. pt tolerated well.

## 2012-09-10 DIAGNOSIS — F71 Moderate intellectual disabilities: Secondary | ICD-10-CM | POA: Insufficient documentation

## 2012-12-02 ENCOUNTER — Ambulatory Visit (HOSPITAL_BASED_OUTPATIENT_CLINIC_OR_DEPARTMENT_OTHER): Admit: 2012-12-02 | Payer: Medicaid Other

## 2012-12-02 ENCOUNTER — Emergency Department (HOSPITAL_BASED_OUTPATIENT_CLINIC_OR_DEPARTMENT_OTHER): Payer: Medicare Other

## 2012-12-02 ENCOUNTER — Encounter (HOSPITAL_BASED_OUTPATIENT_CLINIC_OR_DEPARTMENT_OTHER): Payer: Self-pay | Admitting: *Deleted

## 2012-12-02 ENCOUNTER — Emergency Department (HOSPITAL_BASED_OUTPATIENT_CLINIC_OR_DEPARTMENT_OTHER)
Admission: EM | Admit: 2012-12-02 | Discharge: 2012-12-02 | Disposition: A | Discharge status: Home / Self Care | Payer: Medicare Other | Attending: Emergency Medicine | Admitting: Emergency Medicine

## 2012-12-02 DIAGNOSIS — L03119 Cellulitis of unspecified part of limb: Secondary | ICD-10-CM | POA: Insufficient documentation

## 2012-12-02 DIAGNOSIS — M25569 Pain in unspecified knee: Secondary | ICD-10-CM | POA: Diagnosis present

## 2012-12-02 DIAGNOSIS — L02419 Cutaneous abscess of limb, unspecified: Secondary | ICD-10-CM | POA: Insufficient documentation

## 2012-12-02 DIAGNOSIS — L0390 Cellulitis, unspecified: Secondary | ICD-10-CM

## 2012-12-02 DIAGNOSIS — G8918 Other acute postprocedural pain: Secondary | ICD-10-CM | POA: Insufficient documentation

## 2012-12-02 HISTORY — DX: Autistic disorder: F84.0

## 2012-12-02 HISTORY — DX: Unspecified asthma, uncomplicated: J45.909

## 2012-12-02 LAB — D-DIMER, QUANTITATIVE: D-Dimer, Quant: 0.64 ug/mL-FEU — ABNORMAL HIGH (ref 0.00–0.48)

## 2012-12-02 MED ORDER — TRAMADOL HCL 50 MG PO TABS: 50.0000 mg | ORAL_TABLET | Freq: Four times a day (QID) | ORAL | Status: DC | PRN

## 2012-12-02 MED ORDER — SULFAMETHOXAZOLE-TRIMETHOPRIM 800-160 MG PO TABS: 1.0000 | ORAL_TABLET | Freq: Two times a day (BID) | ORAL | Status: DC

## 2012-12-02 NOTE — ED Notes (Signed)
MD at bedside. 

## 2012-12-02 NOTE — ED Notes (Signed)
Patient transported to X-ray 

## 2012-12-02 NOTE — ED Provider Notes (Signed)
History     CSN: [id number]  Arrival date & time 12/02/12  0127   None     Chief Complaint  Patient presents with  . Knee Pain    (Consider location/radiation/quality/duration/timing/severity/associated sxs/prior treatment) HPI Comments: Patient presents to the ER for evaluation of right knee pain. Patient reports that she had arthroscopic knee surgery at [facility] 3 weeks ago. She has had some slight drainage from one of the surgery sites since the surgery. She was seen in the office for followup and told that it was normal. Patient reports last night she had increased pain in the knee area. She reports the pain is severe he tries to move the knee.  Patient is a 22 y.o. female presenting with knee pain.  Knee Pain   No past medical history on file.  History reviewed. No pertinent past surgical history.  No family history on file.  History  Substance Use Topics  . Smoking status: Not on file  . Smokeless tobacco: Not on file  . Alcohol Use: Not on file      Review of Systems  Musculoskeletal: Positive for arthralgias.  Skin: Positive for wound.  All other systems reviewed and are negative.    Allergies  Bee venom  Home Medications  No current outpatient prescriptions on file.  There were no vitals taken for this visit.  Physical Exam  Constitutional: He is oriented to person, place, and time. He appears well-developed and well-nourished. No distress.  HENT:  Head: Normocephalic and atraumatic.  Right Ear: Hearing normal.  Nose: Nose normal.  Mouth/Throat: Oropharynx is clear and moist and mucous membranes are normal.  Eyes: Conjunctivae and EOM are normal. Pupils are equal, round, and reactive to light.  Neck: Normal range of motion. Neck supple.  Cardiovascular: Normal rate, regular rhythm, S1 normal and S2 normal.  Exam reveals no gallop and no friction rub.   No murmur heard. Pulmonary/Chest: Effort normal and breath sounds normal. No  respiratory distress. He exhibits no tenderness.  Abdominal: Soft. Normal appearance and bowel sounds are normal. There is no hepatosplenomegaly. There is no tenderness. There is no rebound, no guarding, no tenderness at McBurney's point and negative Murphy's sign. No hernia.  Musculoskeletal:       Right knee: He exhibits decreased range of motion. He exhibits no swelling, no effusion, no ecchymosis, no deformity and no laceration. Tenderness found.  Neurological: He is alert and oriented to person, place, and time. He has normal strength. No cranial nerve deficit or sensory deficit. Coordination normal. GCS eye subscore is 4. GCS verbal subscore is 5. GCS motor subscore is 6.  Skin: Skin is warm, dry and intact. No rash noted. No cyanosis.     Psychiatric: He has a normal mood and affect. His speech is normal and behavior is normal. Thought content normal.    ED Course  Procedures (including critical care time)  Labs Reviewed - No data to display No results found.   Diagnosis: Postoperative pain, possible cellulitis    MDM  Patient presents to the ER with complaints of right knee pain. She did have surgery on the knee 3 weeks ago. One of the trocar sites is draining serosanguineous fluid but has some surrounding erythema. Can't rule out infection. Will treat with Bactrim. X-ray does not show any large fluid collections or other abnormalities. No sign of Osteomyelitis.  Patient does not have any significant calf swelling and Homans sign was negative. Because of recent surgery, however, DVT is  considered. D-dimer was slightly elevated, cannot rule out DVT. I do feel that it is unlikely, will schedule for ultrasound tomorrow.        Gilda Crease, MD 12/02/12 [phone]

## 2012-12-02 NOTE — ED Notes (Signed)
Right knee pain x 24 hours

## 2012-12-07 ENCOUNTER — Encounter (HOSPITAL_BASED_OUTPATIENT_CLINIC_OR_DEPARTMENT_OTHER): Payer: Self-pay

## 2012-12-07 ENCOUNTER — Emergency Department (HOSPITAL_BASED_OUTPATIENT_CLINIC_OR_DEPARTMENT_OTHER)
Admission: EM | Admit: 2012-12-07 | Discharge: 2012-12-07 | Disposition: A | Discharge status: Home / Self Care | Payer: Medicaid Other | Attending: Emergency Medicine | Admitting: Emergency Medicine

## 2012-12-07 DIAGNOSIS — R1110 Vomiting, unspecified: Secondary | ICD-10-CM

## 2012-12-07 DIAGNOSIS — R42 Dizziness and giddiness: Secondary | ICD-10-CM | POA: Insufficient documentation

## 2012-12-07 DIAGNOSIS — N390 Urinary tract infection, site not specified: Secondary | ICD-10-CM | POA: Insufficient documentation

## 2012-12-07 DIAGNOSIS — F840 Autistic disorder: Secondary | ICD-10-CM | POA: Insufficient documentation

## 2012-12-07 DIAGNOSIS — J45909 Unspecified asthma, uncomplicated: Secondary | ICD-10-CM | POA: Insufficient documentation

## 2012-12-07 DIAGNOSIS — R112 Nausea with vomiting, unspecified: Secondary | ICD-10-CM | POA: Insufficient documentation

## 2012-12-07 LAB — COMPREHENSIVE METABOLIC PANEL
ALT: 16 U/L (ref 0–35)
AST: 13 U/L (ref 0–37)
Alkaline Phosphatase: 32 U/L — ABNORMAL LOW (ref 39–117)
Calcium: 9.8 mg/dL (ref 8.4–10.5)
GFR calc Af Amer: >90 mL/min (ref >90)
Glucose, Bld: 99 mg/dL (ref 70–99)
Potassium: 3.6 mEq/L (ref 3.5–5.1)
Sodium: 141 mEq/L (ref 135–145)
Total Protein: 7.1 g/dL (ref 6.0–8.3)

## 2012-12-07 LAB — URINALYSIS, ROUTINE W REFLEX MICROSCOPIC
Bilirubin Urine: NEGATIVE
Glucose, UA: NEGATIVE mg/dL
Hgb urine dipstick: NEGATIVE
Specific Gravity, Urine: 1.014 (ref 1.005–1.030)
pH: 7 (ref 5.0–8.0)

## 2012-12-07 LAB — CBC WITH DIFFERENTIAL/PLATELET
Basophils Absolute: 0 10*3/uL (ref 0.0–0.1)
Eosinophils Absolute: 0.1 10*3/uL (ref 0.0–0.7)
Lymphocytes Relative: 33 % (ref 12–46)
Lymphs Abs: 2.4 10*3/uL (ref 0.7–4.0)
MCH: 30 pg (ref 26.0–34.0)
Neutrophils Relative %: 58 % (ref 43–77)
Platelets: 232 10*3/uL (ref 150–400)
RBC: 4.07 MIL/uL (ref 3.87–5.11)
RDW: 12.7 % (ref 11.5–15.5)
WBC: 7.3 10*3/uL (ref 4.0–10.5)

## 2012-12-07 LAB — URINE MICROSCOPIC-ADD ON

## 2012-12-07 LAB — PREGNANCY, URINE: Preg Test, Ur: NEGATIVE

## 2012-12-07 MED ORDER — MECLIZINE HCL 25 MG PO TABS
25.0000 mg | ORAL_TABLET | Freq: Once | ORAL | Status: AC
  Administered 2012-12-07: 25 mg via ORAL
  Filled 2012-12-07: qty 1

## 2012-12-07 MED ORDER — SODIUM CHLORIDE 0.9 % IV BOLUS (SEPSIS)
1000.0000 mL | Freq: Once | INTRAVENOUS | Status: AC
  Administered 2012-12-07: 1000 mL via INTRAVENOUS

## 2012-12-07 MED ORDER — LORAZEPAM 2 MG/ML IJ SOLN
1.0000 mg | Freq: Once | INTRAMUSCULAR | Status: AC
  Administered 2012-12-07: 1 mg via INTRAMUSCULAR
  Filled 2012-12-07: qty 1

## 2012-12-07 MED ORDER — METOCLOPRAMIDE HCL 5 MG/ML IJ SOLN
10.0000 mg | Freq: Once | INTRAMUSCULAR | Status: AC
  Administered 2012-12-07: 10 mg via INTRAMUSCULAR
  Filled 2012-12-07: qty 2

## 2012-12-07 MED ORDER — SODIUM CHLORIDE 0.9 % IV SOLN: INTRAVENOUS | Status: DC

## 2012-12-07 MED ORDER — ONDANSETRON 8 MG PO TBDP
8.0000 mg | ORAL_TABLET | Freq: Once | ORAL | Status: AC
  Administered 2012-12-07: 8 mg via ORAL
  Filled 2012-12-07: qty 1

## 2012-12-07 MED ORDER — METOCLOPRAMIDE HCL 10 MG PO TABS: 10.0000 mg | ORAL_TABLET | Freq: Four times a day (QID) | ORAL | Status: DC | PRN

## 2012-12-07 MED ORDER — LORAZEPAM 2 MG/ML IJ SOLN
1.0000 mg | Freq: Once | INTRAMUSCULAR | Status: AC
  Administered 2012-12-07: 1 mg via INTRAVENOUS
  Filled 2012-12-07: qty 1

## 2012-12-07 MED ORDER — SULFAMETHOXAZOLE-TRIMETHOPRIM 800-160 MG PO TABS: 1.0000 | ORAL_TABLET | Freq: Two times a day (BID) | ORAL | Status: DC

## 2012-12-07 MED ORDER — DIPHENHYDRAMINE HCL 50 MG/ML IJ SOLN
25.0000 mg | Freq: Once | INTRAMUSCULAR | Status: AC
  Administered 2012-12-07: 25 mg via INTRAMUSCULAR
  Filled 2012-12-07: qty 1

## 2012-12-07 NOTE — ED Notes (Signed)
Pt appears to have spit a moderate amount of sputum in the emesis bag.  Pt is talkative and appears to be in no apparent distress, conversing with mother and watching television.

## 2012-12-07 NOTE — ED Provider Notes (Signed)
History     CSN: [id number]  Arrival date & time 12/07/12  6213   First MD Initiated Contact with Patient 12/07/12 0710      Chief Complaint  Patient presents with  . Dizziness  . Nausea    (Consider location/radiation/quality/duration/timing/severity/associated sxs/prior treatment) The history is provided by the patient.   Patient here complaining of dizziness which started prior to arrival. History of similar symptoms associated vertigo and this feels the same. Did have one emesis. No fever or chills. No severe headaches. No unilateral weakness. Symptoms worse with certain movements of her head and better with remaining still. Past Medical History  Diagnosis Date  . Autism   . Asthma     History reviewed. No pertinent past surgical history.  History reviewed. No pertinent family history.  History  Substance Use Topics  . Smoking status: Never Smoker   . Smokeless tobacco: Never Used  . Alcohol Use: No    OB History   Grav Para Term Preterm Abortions TAB SAB Ect Mult Living                  Review of Systems  All other systems reviewed and are negative.    Allergies  Bee venom and Other  Home Medications   Current Outpatient Rx  Name  Route  Sig  Dispense  Refill  . Alum & Mag Hydroxide-Simeth (GI COCKTAIL) SUSP suspension   Oral   Take 30 mLs by mouth as needed for indigestion. Shake well.         Marland Kitchen HYDROcodone-acetaminophen (NORCO/VICODIN) 5-325 MG per tablet   Oral   Take 1 tablet by mouth every 6 (six) hours as needed for pain.         Marland Kitchen sulfamethoxazole-trimethoprim (SEPTRA DS) 800-160 MG per tablet   Oral   Take 1 tablet by mouth every 12 (twelve) hours.   10 tablet   0   . traMADol (ULTRAM) 50 MG tablet   Oral   Take 1 tablet (50 mg total) by mouth every 6 (six) hours as needed for pain.   15 tablet   0     BP 101/65  Pulse 92  Temp(Src) 97.5 F (36.4 C) (Oral)  Resp 20  Ht 5\' 7"  (1.702 m)  Wt 301 lb (136.533 kg)  BMI  47.13 kg/m2  SpO2 98%  Physical Exam  Nursing note and vitals reviewed. Constitutional: She is oriented to person, place, and time. She appears well-developed and well-nourished.  Non-toxic appearance. No distress.  HENT:  Head: Normocephalic and atraumatic.  Eyes: Conjunctivae, EOM and lids are normal. Pupils are equal, round, and reactive to light.  Neck: Normal range of motion. Neck supple. No tracheal deviation present. No mass present.  Cardiovascular: Normal rate, regular rhythm and normal heart sounds.  Exam reveals no gallop.   No murmur heard. Pulmonary/Chest: Effort normal and breath sounds normal. No stridor. No respiratory distress. She has no decreased breath sounds. She has no wheezes. She has no rhonchi. She has no rales.  Abdominal: Soft. Normal appearance and bowel sounds are normal. She exhibits no distension. There is no tenderness. There is no rebound and no CVA tenderness.  Musculoskeletal: Normal range of motion. She exhibits no edema and no tenderness.  Neurological: She is alert and oriented to person, place, and time. She has normal strength. No cranial nerve deficit or sensory deficit. GCS eye subscore is 4. GCS verbal subscore is 5. GCS motor subscore is 6.  Nystagmus noted  Skin: Skin is warm and dry. No abrasion and no rash noted.  Psychiatric: She has a normal mood and affect. Her speech is normal and behavior is normal.    ED Course  Procedures (including critical care time)  Labs Reviewed - No data to display No results found.   No diagnosis found.    MDM  Patient given medications for vertigo. Remains with some vomiting. Denies abdominal pain but does note possible bad food exposure. Her mother was with the patient also notes similar symptoms.       Patient to be given medications for this  10:42 AM Pt rechecked and feels better--will tx for uti, no evidnece of pyelo  Toy Baker, MD 12/07/12 1042

## 2012-12-07 NOTE — ED Notes (Signed)
Pt presents today c/o dizzness and nausea, onset yeseterday.  No vomiting as of yet.  Phenergan and zofran not relieving symptoms.

## 2012-12-07 NOTE — ED Notes (Signed)
Mother states that the patient is throwing up.  MD notified.

## 2012-12-08 ENCOUNTER — Ambulatory Visit (HOSPITAL_BASED_OUTPATIENT_CLINIC_OR_DEPARTMENT_OTHER): Payer: Medicaid Other

## 2012-12-09 LAB — URINE CULTURE

## 2012-12-24 ENCOUNTER — Encounter (HOSPITAL_BASED_OUTPATIENT_CLINIC_OR_DEPARTMENT_OTHER): Payer: Self-pay

## 2012-12-24 ENCOUNTER — Emergency Department (HOSPITAL_BASED_OUTPATIENT_CLINIC_OR_DEPARTMENT_OTHER)
Admission: EM | Admit: 2012-12-24 | Discharge: 2012-12-24 | Disposition: A | Discharge status: Home / Self Care | Payer: Medicaid Other | Attending: Emergency Medicine | Admitting: Emergency Medicine

## 2012-12-24 ENCOUNTER — Emergency Department (HOSPITAL_BASED_OUTPATIENT_CLINIC_OR_DEPARTMENT_OTHER): Payer: Medicaid Other

## 2012-12-24 DIAGNOSIS — Z8659 Personal history of other mental and behavioral disorders: Secondary | ICD-10-CM | POA: Insufficient documentation

## 2012-12-24 DIAGNOSIS — S8990XA Unspecified injury of unspecified lower leg, initial encounter: Secondary | ICD-10-CM | POA: Insufficient documentation

## 2012-12-24 DIAGNOSIS — R296 Repeated falls: Secondary | ICD-10-CM | POA: Insufficient documentation

## 2012-12-24 DIAGNOSIS — Y9301 Activity, walking, marching and hiking: Secondary | ICD-10-CM | POA: Insufficient documentation

## 2012-12-24 DIAGNOSIS — M25561 Pain in right knee: Secondary | ICD-10-CM

## 2012-12-24 DIAGNOSIS — J45909 Unspecified asthma, uncomplicated: Secondary | ICD-10-CM | POA: Insufficient documentation

## 2012-12-24 DIAGNOSIS — Y92009 Unspecified place in unspecified non-institutional (private) residence as the place of occurrence of the external cause: Secondary | ICD-10-CM | POA: Insufficient documentation

## 2012-12-24 NOTE — ED Notes (Signed)
MD at bedside. 

## 2012-12-24 NOTE — ED Notes (Signed)
Fell yesterday-pain to right knee

## 2012-12-24 NOTE — ED Provider Notes (Signed)
History     CSN: [id number]  Arrival date & time 12/24/12  2104   First MD Initiated Contact with Patient 12/24/12 2319      Chief Complaint  Patient presents with  . Fall    (Consider location/radiation/quality/duration/timing/severity/associated sxs/prior treatment) HPI Pt presenting with c/o pain in right knee after fall yesterday.  She was walking in the driveway and carrying her cat when she fell forward onto her right knee.  Has been able to bear weight.  Over one month ago had arthroscopy of this knee, afterward had joint washout for infection.  This has been healing well.  Pain increased after fall yesterday.  No fever/chills, no redness of joint.    Past Medical History  Diagnosis Date  . Autism   . Asthma   . Mental retardation     History reviewed. No pertinent past surgical history.  No family history on file.  History  Substance Use Topics  . Smoking status: Never Smoker   . Smokeless tobacco: Never Used  . Alcohol Use: No    OB History   Grav Para Term Preterm Abortions TAB SAB Ect Mult Living                  Review of Systems ROS reviewed and all otherwise negative except for mentioned in HPI  Allergies  Bee venom and Other  Home Medications   Current Outpatient Rx  Name  Route  Sig  Dispense  Refill  . Alum & Mag Hydroxide-Simeth (GI COCKTAIL) SUSP suspension   Oral   Take 30 mLs by mouth as needed for indigestion. Shake well.         Marland Kitchen HYDROcodone-acetaminophen (NORCO/VICODIN) 5-325 MG per tablet   Oral   Take 1 tablet by mouth every 6 (six) hours as needed for pain.         Marland Kitchen metoCLOPramide (REGLAN) 10 MG tablet   Oral   Take 1 tablet (10 mg total) by mouth every 6 (six) hours as needed (nausea).   12 tablet   0   . ondansetron (ZOFRAN-ODT) 4 MG disintegrating tablet   Oral   Take 4 mg by mouth every 8 (eight) hours as needed for nausea.         . promethazine (PHENERGAN) 25 MG tablet   Oral   Take 25 mg by mouth every 6  (six) hours as needed for nausea.         Marland Kitchen sulfamethoxazole-trimethoprim (SEPTRA DS) 800-160 MG per tablet   Oral   Take 1 tablet by mouth every 12 (twelve) hours.   10 tablet   0   . sulfamethoxazole-trimethoprim (SEPTRA DS) 800-160 MG per tablet   Oral   Take 1 tablet by mouth every 12 (twelve) hours.   10 tablet   0   . traMADol (ULTRAM) 50 MG tablet   Oral   Take 1 tablet (50 mg total) by mouth every 6 (six) hours as needed for pain.   15 tablet   0     BP 108/86  Pulse 90  Temp(Src) 98.7 F (37.1 C) (Oral)  Resp 20  SpO2 99% Vitals reviewed Physical Exam Physical Examination: General appearance - alert, well appearing, and in no distress Mental status - alert, oriented to person, place, and time Eyes - no conjunctival injection, no scleral icterus Neurological - alert, oriented, right lower extrmity distally NVI Musculoskeletal - no joint tenderness, deformity or swelling Extremities - peripheral pulses normal, no pedal edema, no  clubbing or cyanosis Skin - normal coloration and turgor except for approx 3cm diameter bruise over left knee, arthroscopy incision sites well healing, c/d/i, no rashes  ED Course  Procedures (including critical care time)  Labs Reviewed - No data to display Dg Knee Complete 4 Views Right  12/24/2012  *RADIOLOGY REPORT*  Clinical Data: Right knee pain after fall today.  Bruising. History of patellar surgery on 11/05/2012.  RIGHT KNEE - COMPLETE 4+ VIEW  Comparison: 12/02/2012  Findings: Small effusion is suggested.  Right knee appears otherwise intact. No evidence of acute fracture or subluxation.  No focal bone lesions.  Bone matrix and cortex appear intact.  No abnormal radiopaque densities in the soft tissues.  IMPRESSION: Small right knee effusion.  No acute fractures demonstrated.   Original Report Authenticated By: Burman Nieves, M.D.      1. Knee pain, right       MDM  Pt with bruising and mild joint effusion after fall  yesterday.  She has had recent arthroscopy and joint washout- no sign of infection now.  Pt already has knee immoblizer, crutches at home advised to use these again and to call her orthopedic doctor in [place] for recheck.  She is taking hydrocodone.  Discharged with strict return precautions.  Pt agreeable with plan.        Ethelda Chick, MD 12/25/12 [phone]

## 2013-01-12 ENCOUNTER — Encounter (HOSPITAL_BASED_OUTPATIENT_CLINIC_OR_DEPARTMENT_OTHER): Payer: Self-pay | Admitting: Emergency Medicine

## 2013-01-12 ENCOUNTER — Emergency Department (HOSPITAL_BASED_OUTPATIENT_CLINIC_OR_DEPARTMENT_OTHER)
Admission: EM | Admit: 2013-01-12 | Discharge: 2013-01-12 | Disposition: A | Discharge status: Home / Self Care | Payer: Medicaid Other | Attending: Emergency Medicine | Admitting: Emergency Medicine

## 2013-01-12 DIAGNOSIS — Z8659 Personal history of other mental and behavioral disorders: Secondary | ICD-10-CM | POA: Insufficient documentation

## 2013-01-12 DIAGNOSIS — F840 Autistic disorder: Secondary | ICD-10-CM | POA: Insufficient documentation

## 2013-01-12 DIAGNOSIS — J45909 Unspecified asthma, uncomplicated: Secondary | ICD-10-CM | POA: Insufficient documentation

## 2013-01-12 DIAGNOSIS — B372 Candidiasis of skin and nail: Secondary | ICD-10-CM | POA: Insufficient documentation

## 2013-01-12 MED ORDER — NYSTATIN 100000 UNIT/GM EX POWD
CUTANEOUS | Status: AC
  Filled 2013-01-12: qty 15

## 2013-01-12 NOTE — ED Provider Notes (Signed)
History     CSN: [id number]  Arrival date & time 01/12/13  0444   First MD Initiated Contact with Patient 01/12/13 0502      Chief Complaint  Patient presents with  . Rash    (Consider location/radiation/quality/duration/timing/severity/associated sxs/prior treatment) HPI Rash on abdomen for unknown period of time.  On nystantin cream and powder but continues to irritae.  Seen at pmd office yesterday but didn't want female provider to see.  No spreading redness in intertiginous area.  No fever or chills.  Past Medical History  Diagnosis Date  . Autism   . Asthma   . Mental retardation     History reviewed. No pertinent past surgical history.  History reviewed. No pertinent family history.  History  Substance Use Topics  . Smoking status: Never Smoker   . Smokeless tobacco: Never Used  . Alcohol Use: No    OB History   Grav Para Term Preterm Abortions TAB SAB Ect Mult Living                  Review of Systems  All other systems reviewed and are negative.    Allergies  Bee venom and Other  Home Medications   Current Outpatient Rx  Name  Route  Sig  Dispense  Refill  . metoCLOPramide (REGLAN) 10 MG tablet   Oral   Take 1 tablet (10 mg total) by mouth every 6 (six) hours as needed (nausea).   12 tablet   0   . traMADol (ULTRAM) 50 MG tablet   Oral   Take 1 tablet (50 mg total) by mouth every 6 (six) hours as needed for pain.   15 tablet   0   . Alum & Mag Hydroxide-Simeth (GI COCKTAIL) SUSP suspension   Oral   Take 30 mLs by mouth as needed for indigestion. Shake well.         Marland Kitchen HYDROcodone-acetaminophen (NORCO/VICODIN) 5-325 MG per tablet   Oral   Take 1 tablet by mouth every 6 (six) hours as needed for pain.         Marland Kitchen ondansetron (ZOFRAN-ODT) 4 MG disintegrating tablet   Oral   Take 4 mg by mouth every 8 (eight) hours as needed for nausea.         . promethazine (PHENERGAN) 25 MG tablet   Oral   Take 25 mg by mouth every 6 (six) hours  as needed for nausea.         Marland Kitchen sulfamethoxazole-trimethoprim (SEPTRA DS) 800-160 MG per tablet   Oral   Take 1 tablet by mouth every 12 (twelve) hours.   10 tablet   0   . sulfamethoxazole-trimethoprim (SEPTRA DS) 800-160 MG per tablet   Oral   Take 1 tablet by mouth every 12 (twelve) hours.   10 tablet   0     BP 145/82  Pulse 64  Temp(Src) 98.1 F (36.7 C) (Oral)  Resp 20  SpO2 99%  Physical Exam  Nursing note and vitals reviewed. Constitutional: She is oriented to person, place, and time. She appears well-developed and well-nourished.  Morbidly obese   HENT:  Right Ear: External ear normal.  Eyes: Pupils are equal, round, and reactive to light.  Neck: Normal range of motion.  Cardiovascular: Normal rate and regular rhythm.   Pulmonary/Chest: Effort normal and breath sounds normal.  Abdominal: Soft. Bowel sounds are normal.  Neurological: She is alert and oriented to person, place, and time.  Skin: Skin is warm  and dry.  ertyhematous area between skin folds on abdomen   Psychiatric: She has a normal mood and affect.    ED Course  Procedures (including critical care time)  Labs Reviewed - No data to display No results found.   No diagnosis found.    MDM        Hilario Quarry, MD 01/12/13 [phone]

## 2013-01-12 NOTE — ED Notes (Signed)
Pt reports rash between abdominal folds started 4/24, saw pcp, started on nystatin cream and powder, daily after bathing and patting area dry,

## 2013-02-05 ENCOUNTER — Emergency Department (HOSPITAL_BASED_OUTPATIENT_CLINIC_OR_DEPARTMENT_OTHER)
Admission: EM | Admit: 2013-02-05 | Discharge: 2013-02-05 | Disposition: A | Discharge status: Home / Self Care | Payer: 59 | Attending: Emergency Medicine | Admitting: Emergency Medicine

## 2013-02-05 ENCOUNTER — Encounter (HOSPITAL_BASED_OUTPATIENT_CLINIC_OR_DEPARTMENT_OTHER): Payer: Self-pay | Admitting: *Deleted

## 2013-02-05 DIAGNOSIS — J45909 Unspecified asthma, uncomplicated: Secondary | ICD-10-CM | POA: Insufficient documentation

## 2013-02-05 DIAGNOSIS — K219 Gastro-esophageal reflux disease without esophagitis: Secondary | ICD-10-CM

## 2013-02-05 DIAGNOSIS — R112 Nausea with vomiting, unspecified: Secondary | ICD-10-CM | POA: Insufficient documentation

## 2013-02-05 DIAGNOSIS — Z79899 Other long term (current) drug therapy: Secondary | ICD-10-CM | POA: Insufficient documentation

## 2013-02-05 DIAGNOSIS — F79 Unspecified intellectual disabilities: Secondary | ICD-10-CM | POA: Insufficient documentation

## 2013-02-05 DIAGNOSIS — K5190 Ulcerative colitis, unspecified, without complications: Secondary | ICD-10-CM | POA: Insufficient documentation

## 2013-02-05 DIAGNOSIS — F840 Autistic disorder: Secondary | ICD-10-CM | POA: Insufficient documentation

## 2013-02-05 DIAGNOSIS — Z8719 Personal history of other diseases of the digestive system: Secondary | ICD-10-CM | POA: Insufficient documentation

## 2013-02-05 DIAGNOSIS — R079 Chest pain, unspecified: Secondary | ICD-10-CM | POA: Insufficient documentation

## 2013-02-05 DIAGNOSIS — Z792 Long term (current) use of antibiotics: Secondary | ICD-10-CM | POA: Insufficient documentation

## 2013-02-05 DIAGNOSIS — IMO0002 Reserved for concepts with insufficient information to code with codable children: Secondary | ICD-10-CM | POA: Insufficient documentation

## 2013-02-05 HISTORY — DX: Ulcerative colitis, unspecified, without complications: K51.90

## 2013-02-05 MED ORDER — METOCLOPRAMIDE HCL 10 MG PO TABS: 10.0000 mg | ORAL_TABLET | Freq: Three times a day (TID) | ORAL | Status: DC | PRN

## 2013-02-05 MED ORDER — METOCLOPRAMIDE HCL 5 MG/ML IJ SOLN
10.0000 mg | Freq: Once | INTRAMUSCULAR | Status: AC
  Administered 2013-02-05: 10 mg via INTRAVENOUS
  Filled 2013-02-05: qty 2

## 2013-02-05 MED ORDER — PANTOPRAZOLE SODIUM 40 MG IV SOLR
40.0000 mg | Freq: Once | INTRAVENOUS | Status: AC
  Administered 2013-02-05: 40 mg via INTRAVENOUS
  Filled 2013-02-05: qty 40

## 2013-02-05 MED ORDER — SODIUM CHLORIDE 0.9 % IV BOLUS (SEPSIS)
500.0000 mL | Freq: Once | INTRAVENOUS | Status: AC
  Administered 2013-02-05: 500 mL via INTRAVENOUS

## 2013-02-05 MED ORDER — GI COCKTAIL ~~LOC~~
30.0000 mL | Freq: Once | ORAL | Status: AC
  Administered 2013-02-05: 30 mL via ORAL
  Filled 2013-02-05: qty 30

## 2013-02-05 MED ORDER — GI COCKTAIL ~~LOC~~: 30.0000 mL | Freq: Three times a day (TID) | ORAL | Status: DC | PRN

## 2013-02-05 NOTE — ED Notes (Signed)
Pt has esophagitis and has had abdominal pain and chest pain on and off for 1 month. Pt began vomiting yesterday and her GI cocktail is no longer helping.

## 2013-02-05 NOTE — ED Provider Notes (Signed)
History     CSN: [id number]  Arrival date & time 02/05/13  0206   First MD Initiated Contact with Patient 02/05/13 [phone]      Chief Complaint  Patient presents with  . Abdominal Pain    (Consider location/radiation/quality/duration/timing/severity/associated sxs/prior treatment) HPI Is a 22 year old female with history of ulcerative colitis. She also has a history of esophagitis. She is here with nausea, vomiting, epigastric and substernal pain that began yesterday evening. She is equivocal about whether this pain is like that of previous esophagitis. She has had similar episodes in the past. She is not currently on an acid blocker but is on an antacid/simethicone suspension; she states it is not helping. She states Zofran and Phenergan are not helping her nausea and is requesting Reglan. Her pain is not severe at this time. Her bowel movements have been normal for her without blood or other evidence of acute exacerbation of her ulcerative colitis.  Past Medical History  Diagnosis Date  . Autism   . Asthma   . Mental retardation     History reviewed. No pertinent past surgical history.  No family history on file.  History  Substance Use Topics  . Smoking status: Never Smoker   . Smokeless tobacco: Never Used  . Alcohol Use: No    OB History   Grav Para Term Preterm Abortions TAB SAB Ect Mult Living                  Review of Systems  All other systems reviewed and are negative.    Allergies  Bee venom and Other  Home Medications   Current Outpatient Rx  Name  Route  Sig  Dispense  Refill  . amoxicillin (AMOXIL) 500 MG tablet   Oral   Take 1,000 mg by mouth 2 (two) times daily.         Marland Kitchen levocetirizine (XYZAL) 5 MG tablet   Oral   Take 5 mg by mouth every evening.         Marland Kitchen levothyroxine (SYNTHROID, LEVOTHROID) 88 MCG tablet   Oral   Take 88 mcg by mouth daily before breakfast.         . mesalamine (APRISO) 0.375 G 24 hr capsule   Oral   Take  375 mg by mouth 4 (four) times daily.         . montelukast (SINGULAIR) 10 MG tablet   Oral   Take 10 mg by mouth at bedtime.         . sucralfate (CARAFATE) 1 G tablet   Oral   Take 1 g by mouth 4 (four) times daily.         . ziprasidone (GEODON) 60 MG capsule   Oral   Take 60 mg by mouth 2 (two) times daily with a meal.         . Alum & Mag Hydroxide-Simeth (GI COCKTAIL) SUSP suspension   Oral   Take 30 mLs by mouth as needed for indigestion. Shake well.         Marland Kitchen HYDROcodone-acetaminophen (NORCO/VICODIN) 5-325 MG per tablet   Oral   Take 1 tablet by mouth every 6 (six) hours as needed for pain.         Marland Kitchen metoCLOPramide (REGLAN) 10 MG tablet   Oral   Take 1 tablet (10 mg total) by mouth every 6 (six) hours as needed (nausea).   12 tablet   0   . ondansetron (ZOFRAN-ODT) 4 MG disintegrating tablet  Oral   Take 4 mg by mouth every 8 (eight) hours as needed for nausea.         . promethazine (PHENERGAN) 25 MG tablet   Oral   Take 25 mg by mouth every 6 (six) hours as needed for nausea.         Marland Kitchen sulfamethoxazole-trimethoprim (SEPTRA DS) 800-160 MG per tablet   Oral   Take 1 tablet by mouth every 12 (twelve) hours.   10 tablet   0   . sulfamethoxazole-trimethoprim (SEPTRA DS) 800-160 MG per tablet   Oral   Take 1 tablet by mouth every 12 (twelve) hours.   10 tablet   0   . traMADol (ULTRAM) 50 MG tablet   Oral   Take 1 tablet (50 mg total) by mouth every 6 (six) hours as needed for pain.   15 tablet   0     BP 124/60  Pulse 94  Temp(Src) 98.8 F (37.1 C) (Oral)  Resp 18  Ht 5\' 7"  (1.702 m)  Wt 298 lb (135.172 kg)  BMI 46.66 kg/m2  SpO2 96%  Physical Exam General: Well-developed, obese female in no acute distress; appearance consistent with age of record HENT: normocephalic, atraumatic Eyes: pupils equal round and reactive to light; extraocular muscles intact Neck: supple Heart: regular rate and rhythm Lungs: clear to auscultation  bilaterally Abdomen: soft; nondistended; epigastric tenderness; bowel sounds present Extremities: No deformity; full range of motion; pulses normal Neurologic: Awake, alert; motor function intact in all extremities and symmetric; no facial droop Skin: Warm and dry Psychiatric: Normal mood and affect    ED Course  Procedures (including critical care time)    MDM  3:54 AM Pain and nausea significantly improved after IV medications and GI cocktail. Patient is planning on her iPad at this time.        Hanley Seamen, MD 02/05/13 [phone]

## 2013-02-24 ENCOUNTER — Encounter (HOSPITAL_BASED_OUTPATIENT_CLINIC_OR_DEPARTMENT_OTHER): Payer: Self-pay

## 2013-02-24 ENCOUNTER — Emergency Department (HOSPITAL_BASED_OUTPATIENT_CLINIC_OR_DEPARTMENT_OTHER)
Admission: EM | Admit: 2013-02-24 | Discharge: 2013-02-25 | Disposition: A | Discharge status: Home / Self Care | Payer: Medicaid Other | Attending: Emergency Medicine | Admitting: Emergency Medicine

## 2013-02-24 ENCOUNTER — Emergency Department (HOSPITAL_BASED_OUTPATIENT_CLINIC_OR_DEPARTMENT_OTHER): Payer: Medicaid Other

## 2013-02-24 DIAGNOSIS — M25569 Pain in unspecified knee: Secondary | ICD-10-CM | POA: Insufficient documentation

## 2013-02-24 DIAGNOSIS — F3289 Other specified depressive episodes: Secondary | ICD-10-CM | POA: Insufficient documentation

## 2013-02-24 DIAGNOSIS — K5190 Ulcerative colitis, unspecified, without complications: Secondary | ICD-10-CM | POA: Insufficient documentation

## 2013-02-24 DIAGNOSIS — Z79899 Other long term (current) drug therapy: Secondary | ICD-10-CM | POA: Insufficient documentation

## 2013-02-24 DIAGNOSIS — K219 Gastro-esophageal reflux disease without esophagitis: Secondary | ICD-10-CM | POA: Insufficient documentation

## 2013-02-24 DIAGNOSIS — M25562 Pain in left knee: Secondary | ICD-10-CM

## 2013-02-24 DIAGNOSIS — F909 Attention-deficit hyperactivity disorder, unspecified type: Secondary | ICD-10-CM | POA: Insufficient documentation

## 2013-02-24 DIAGNOSIS — F840 Autistic disorder: Secondary | ICD-10-CM | POA: Insufficient documentation

## 2013-02-24 DIAGNOSIS — F329 Major depressive disorder, single episode, unspecified: Secondary | ICD-10-CM | POA: Insufficient documentation

## 2013-02-24 DIAGNOSIS — K209 Esophagitis, unspecified without bleeding: Secondary | ICD-10-CM | POA: Insufficient documentation

## 2013-02-24 DIAGNOSIS — J45909 Unspecified asthma, uncomplicated: Secondary | ICD-10-CM | POA: Insufficient documentation

## 2013-02-24 DIAGNOSIS — E079 Disorder of thyroid, unspecified: Secondary | ICD-10-CM | POA: Insufficient documentation

## 2013-02-24 HISTORY — DX: Esophagitis, unspecified: K20.9

## 2013-02-24 HISTORY — DX: Disorder of thyroid, unspecified: E07.9

## 2013-02-24 HISTORY — DX: Gastro-esophageal reflux disease without esophagitis: K21.9

## 2013-02-24 HISTORY — DX: Major depressive disorder, single episode, unspecified: F32.9

## 2013-02-24 HISTORY — DX: Esophagitis, unspecified without bleeding: K20.90

## 2013-02-24 HISTORY — DX: Depression, unspecified: F32.A

## 2013-02-24 NOTE — ED Notes (Signed)
Pt complains of left knee pain.  Denies injury.

## 2013-02-24 NOTE — ED Provider Notes (Signed)
History    CSN: [id number] Arrival date & time 02/24/13  2239  First MD Initiated Contact with Patient 02/24/13 2300     Chief Complaint  Patient presents with  . Knee Pain   (Consider location/radiation/quality/duration/timing/severity/associated sxs/prior Treatment) Patient is a 22 y.o. female presenting with knee pain. The history is provided by the patient and a parent.  Knee Pain Location:  Knee Injury: no   Knee location:  L knee Pain details:    Quality:  Aching   Radiates to:  Does not radiate   Severity:  Moderate   Onset quality:  Gradual   Timing:  Constant   Progression:  Worsening Chronicity:  Recurrent Dislocation: no   Foreign body present:  No foreign bodies Relieved by:  Nothing Worsened by:  Nothing tried Ineffective treatments:  None tried Associated symptoms: no back pain   Risk factors: no concern for non-accidental trauma   Seen for same by her orthopedist who told the patient she has arthritis.  Call tonight and told she needs injections and therapy and presents for same.   Past Medical History  Diagnosis Date  . Autism   . Asthma   . Ulcerative colitis   . ADHD (attention deficit hyperactivity disorder)   . Esophagitis   . GERD (gastroesophageal reflux disease)   . Thyroid disease   . Depression    Past Surgical History  Procedure Laterality Date  . Knee arthroscopy     No family history on file. History  Substance Use Topics  . Smoking status: Never Smoker   . Smokeless tobacco: Never Used  . Alcohol Use: No   OB History   Grav Para Term Preterm Abortions TAB SAB Ect Mult Living                 Review of Systems  Musculoskeletal: Negative for back pain.  All other systems reviewed and are negative.    Allergies  Bee venom and Other  Home Medications   Current Outpatient Rx  Name  Route  Sig  Dispense  Refill  . lansoprazole (PREVACID) 30 MG capsule   Oral   Take 30 mg by mouth 2 (two) times daily.         . Alum  & Mag Hydroxide-Simeth (GI COCKTAIL) SUSP suspension   Oral   Take 30 mLs by mouth as needed for indigestion. Shake well.         . Alum & Mag Hydroxide-Simeth (GI COCKTAIL) SUSP suspension   Oral   Take 30 mLs by mouth 3 (three) times daily as needed (for heartburn). Shake well.   300 mL   1   . amoxicillin (AMOXIL) 500 MG tablet   Oral   Take 1,000 mg by mouth 2 (two) times daily.         Marland Kitchen HYDROcodone-acetaminophen (NORCO/VICODIN) 5-325 MG per tablet   Oral   Take 1 tablet by mouth every 6 (six) hours as needed for pain.         Marland Kitchen levocetirizine (XYZAL) 5 MG tablet   Oral   Take 5 mg by mouth every evening.         Marland Kitchen levothyroxine (SYNTHROID, LEVOTHROID) 88 MCG tablet   Oral   Take 88 mcg by mouth daily before breakfast.         . mesalamine (APRISO) 0.375 G 24 hr capsule   Oral   Take 375 mg by mouth 4 (four) times daily.         Marland Kitchen  metoCLOPramide (REGLAN) 10 MG tablet   Oral   Take 1 tablet (10 mg total) by mouth every 8 (eight) hours as needed (for nausea).   10 tablet   0   . montelukast (SINGULAIR) 10 MG tablet   Oral   Take 10 mg by mouth at bedtime.         . ondansetron (ZOFRAN-ODT) 4 MG disintegrating tablet   Oral   Take 4 mg by mouth every 8 (eight) hours as needed for nausea.         . promethazine (PHENERGAN) 25 MG tablet   Oral   Take 25 mg by mouth every 6 (six) hours as needed for nausea.         . sucralfate (CARAFATE) 1 G tablet   Oral   Take 1 g by mouth 4 (four) times daily.         Marland Kitchen sulfamethoxazole-trimethoprim (SEPTRA DS) 800-160 MG per tablet   Oral   Take 1 tablet by mouth every 12 (twelve) hours.   10 tablet   0   . sulfamethoxazole-trimethoprim (SEPTRA DS) 800-160 MG per tablet   Oral   Take 1 tablet by mouth every 12 (twelve) hours.   10 tablet   0   . traMADol (ULTRAM) 50 MG tablet   Oral   Take 1 tablet (50 mg total) by mouth every 6 (six) hours as needed for pain.   15 tablet   0   .  ziprasidone (GEODON) 60 MG capsule   Oral   Take 60 mg by mouth 2 (two) times daily with a meal.          BP 120/71  Pulse 105  Temp(Src) 98.4 F (36.9 C) (Oral)  Resp 20  Ht 5\' 7"  (1.702 m)  Wt 295 lb (133.811 kg)  BMI 46.19 kg/m2  SpO2 96% Physical Exam  Constitutional: She is oriented to person, place, and time. She appears well-developed and well-nourished. No distress.  HENT:  Head: Normocephalic and atraumatic.  Eyes: Conjunctivae are normal. Pupils are equal, round, and reactive to light.  Neck: Normal range of motion. Neck supple.  Cardiovascular: Normal rate, regular rhythm and intact distal pulses.   Pulmonary/Chest: Effort normal and breath sounds normal.  Abdominal: Soft. Bowel sounds are normal. There is no tenderness. There is no rebound.  Musculoskeletal: Normal range of motion. She exhibits no edema and no tenderness.  No patella [place] or [place]. Intact patellar and quadriceps tendons.  Negative anterior and posterior drawer tests B.    Neurological: She is alert and oriented to person, place, and time. She has normal reflexes.  Skin: Skin is warm and dry.  Psychiatric: She has a normal mood and affect.    ED Course  Procedures (including critical care time) Labs Reviewed - No data to display No results found. No diagnosis found.  MDM  Lidocaine patches follow up with your orthopedist in the am  Butler Vegh K Dereon Williamsen-Rasch, MD 02/25/13 [phone]

## 2013-02-24 NOTE — ED Notes (Signed)
Pt was seen one week ago at Ortho told that she had arthritis in her left knee.

## 2013-02-24 NOTE — ED Notes (Signed)
MD at bedside. 

## 2013-02-25 MED ORDER — LIDOCAINE 5 % EX PTCH: 1.0000 | MEDICATED_PATCH | CUTANEOUS | Status: DC

## 2013-02-25 NOTE — ED Notes (Signed)
MD at bedside giving test results and discussing plan of care for discharge and followup care.

## 2013-03-13 ENCOUNTER — Emergency Department (HOSPITAL_BASED_OUTPATIENT_CLINIC_OR_DEPARTMENT_OTHER)
Admission: EM | Admit: 2013-03-13 | Discharge: 2013-03-14 | Disposition: A | Discharge status: Home / Self Care | Payer: Medicaid Other | Attending: Emergency Medicine | Admitting: Emergency Medicine

## 2013-03-13 ENCOUNTER — Encounter (HOSPITAL_BASED_OUTPATIENT_CLINIC_OR_DEPARTMENT_OTHER): Payer: Self-pay | Admitting: *Deleted

## 2013-03-13 DIAGNOSIS — J45909 Unspecified asthma, uncomplicated: Secondary | ICD-10-CM | POA: Insufficient documentation

## 2013-03-13 DIAGNOSIS — E079 Disorder of thyroid, unspecified: Secondary | ICD-10-CM | POA: Insufficient documentation

## 2013-03-13 DIAGNOSIS — Z79899 Other long term (current) drug therapy: Secondary | ICD-10-CM | POA: Insufficient documentation

## 2013-03-13 DIAGNOSIS — K219 Gastro-esophageal reflux disease without esophagitis: Secondary | ICD-10-CM | POA: Insufficient documentation

## 2013-03-13 DIAGNOSIS — R51 Headache: Secondary | ICD-10-CM | POA: Insufficient documentation

## 2013-03-13 DIAGNOSIS — Z8659 Personal history of other mental and behavioral disorders: Secondary | ICD-10-CM | POA: Insufficient documentation

## 2013-03-13 DIAGNOSIS — Z8719 Personal history of other diseases of the digestive system: Secondary | ICD-10-CM | POA: Insufficient documentation

## 2013-03-13 DIAGNOSIS — F329 Major depressive disorder, single episode, unspecified: Secondary | ICD-10-CM | POA: Insufficient documentation

## 2013-03-13 DIAGNOSIS — Z792 Long term (current) use of antibiotics: Secondary | ICD-10-CM | POA: Insufficient documentation

## 2013-03-13 DIAGNOSIS — F909 Attention-deficit hyperactivity disorder, unspecified type: Secondary | ICD-10-CM | POA: Insufficient documentation

## 2013-03-13 DIAGNOSIS — F3289 Other specified depressive episodes: Secondary | ICD-10-CM | POA: Insufficient documentation

## 2013-03-13 NOTE — ED Notes (Signed)
Reports "my heads been hurting for a month"

## 2013-03-13 NOTE — ED Provider Notes (Signed)
History  This chart was scribed for Kanishk Stroebel Smitty Cords, MD by Ardelia Mems, ED Scribe. This patient was seen in room MH05/[facility]05 and the patient's care was started at 11:57 PM.  CSN: [id number]  Arrival date & time 03/13/13  2339   Chief Complaint  Patient presents with  . Headache    Patient is a 22 y.o. female presenting with headaches. The history is provided by the patient. No language interpreter was used.  Headache Pain location:  R temporal Quality:  Dull Radiates to:  Does not radiate Onset quality:  Gradual Timing:  Intermittent Progression:  Unchanged Chronicity:  Recurrent Context: not exposure to bright light, not caffeine and not coughing   Relieved by:  Nothing Worsened by:  Nothing tried Ineffective treatments:  None tried Associated symptoms: no abdominal pain, no back pain, no congestion, no cough, no dizziness, no ear pain, no pain, no facial pain, no fever, no neck pain, no neck stiffness, no photophobia, no seizures, no sinus pressure, no sore throat, no swollen glands, no URI and no vomiting   Risk factors: no anger    HPI Comments: Jasmine Hickman is a 22 y.o. Female with a hx of autism, depression and ADHD who presents to the Emergency Department complaining of an intermittent.. She states that she has not seen her PCP for this. She states that she has taken 500 mg of Tylenol TID without relief. She does not appear to be in any distress whatsoever currently. She denies visual disturbances, photophobia, ear pain, neck pain or any other symptoms.  PCP- Dr. Primus Bravo  Past Medical History  Diagnosis Date  . Autism   . Asthma   . Ulcerative colitis   . ADHD (attention deficit hyperactivity disorder)   . Esophagitis   . GERD (gastroesophageal reflux disease)   . Thyroid disease   . Depression    Past Surgical History  Procedure Laterality Date  . Knee arthroscopy     No family history on file. History  Substance Use Topics  . Smoking status:  Never Smoker   . Smokeless tobacco: Never Used  . Alcohol Use: No   OB History   Grav Para Term Preterm Abortions TAB SAB Ect Mult Living                 Review of Systems  Constitutional: Negative for fever.  HENT: Negative for ear pain, congestion, sore throat, neck pain, neck stiffness and sinus pressure.   Eyes: Negative for photophobia, pain and visual disturbance.  Respiratory: Negative for cough.   Gastrointestinal: Negative for vomiting and abdominal pain.  Musculoskeletal: Negative for back pain.  Neurological: Positive for headaches. Negative for dizziness, seizures, facial asymmetry and weakness.  All other systems reviewed and are negative.    Allergies  Bee venom and Other  Home Medications   Current Outpatient Rx  Name  Route  Sig  Dispense  Refill  . Alum & Mag Hydroxide-Simeth (GI COCKTAIL) SUSP suspension   Oral   Take 30 mLs by mouth as needed for indigestion. Shake well.         . Alum & Mag Hydroxide-Simeth (GI COCKTAIL) SUSP suspension   Oral   Take 30 mLs by mouth 3 (three) times daily as needed (for heartburn). Shake well.   300 mL   1   . HYDROcodone-acetaminophen (NORCO/VICODIN) 5-325 MG per tablet   Oral   Take 1 tablet by mouth every 6 (six) hours as needed for pain.         Marland Kitchen  lansoprazole (PREVACID) 30 MG capsule   Oral   Take 30 mg by mouth 2 (two) times daily.         Marland Kitchen levocetirizine (XYZAL) 5 MG tablet   Oral   Take 5 mg by mouth every evening.         Marland Kitchen levothyroxine (SYNTHROID, LEVOTHROID) 88 MCG tablet   Oral   Take 88 mcg by mouth daily before breakfast.         . mesalamine (APRISO) 0.375 G 24 hr capsule   Oral   Take 375 mg by mouth 4 (four) times daily.         . metoCLOPramide (REGLAN) 10 MG tablet   Oral   Take 1 tablet (10 mg total) by mouth every 8 (eight) hours as needed (for nausea).   10 tablet   0   . montelukast (SINGULAIR) 10 MG tablet   Oral   Take 10 mg by mouth at bedtime.         .  ondansetron (ZOFRAN-ODT) 4 MG disintegrating tablet   Oral   Take 4 mg by mouth every 8 (eight) hours as needed for nausea.         . promethazine (PHENERGAN) 25 MG tablet   Oral   Take 25 mg by mouth every 6 (six) hours as needed for nausea.         . sucralfate (CARAFATE) 1 G tablet   Oral   Take 1 g by mouth 4 (four) times daily.         . traMADol (ULTRAM) 50 MG tablet   Oral   Take 1 tablet (50 mg total) by mouth every 6 (six) hours as needed for pain.   15 tablet   0   . ziprasidone (GEODON) 60 MG capsule   Oral   Take 60 mg by mouth 2 (two) times daily with a meal.         . amoxicillin (AMOXIL) 500 MG tablet   Oral   Take 1,000 mg by mouth 2 (two) times daily.         Marland Kitchen lidocaine (LIDODERM) 5 %   Transdermal   Place 1 patch onto the skin daily. Remove & Discard patch within 12 hours or as directed by MD   6 patch   0   . sulfamethoxazole-trimethoprim (SEPTRA DS) 800-160 MG per tablet   Oral   Take 1 tablet by mouth every 12 (twelve) hours.   10 tablet   0   . sulfamethoxazole-trimethoprim (SEPTRA DS) 800-160 MG per tablet   Oral   Take 1 tablet by mouth every 12 (twelve) hours.   10 tablet   0    Triage Vitals: BP 130/83  Pulse 93  Temp(Src) 97.7 F (36.5 C) (Oral)  Resp 20  SpO2 98%  Physical Exam  Nursing note and vitals reviewed. Constitutional: She is oriented to person, place, and time. She appears well-developed and well-nourished. No distress.  HENT:  Head: Normocephalic and atraumatic.  Mouth/Throat: Oropharynx is clear and moist.  Eyes: EOM are normal. Pupils are equal, round, and reactive to light.  Neck: Normal range of motion. Neck supple.  Cardiovascular: Normal rate, regular rhythm, normal heart sounds and intact distal pulses.   Pulmonary/Chest: Effort normal and breath sounds normal. No respiratory distress.  Abdominal: Soft. Bowel sounds are normal. There is no tenderness. There is no rebound and no guarding.   Musculoskeletal: Normal range of motion.  Lymphadenopathy:    She has  no cervical adenopathy.  Neurological: She is alert and oriented to person, place, and time. She has normal reflexes. No cranial nerve deficit.  Skin: Skin is warm and dry.  Psychiatric: She has a normal mood and affect.    ED Course  Procedures (including critical care time)  DIAGNOSTIC STUDIES: Oxygen Saturation is 98% on RA, normal by my interpretation.    COORDINATION OF CARE: 11:56 AM- Pt advised of plan for treatment and pt agrees.  Medications  ibuprofen (ADVIL,MOTRIN) tablet 800 mg (not administered)  metoCLOPramide (REGLAN) tablet 10 mg (not administered)  diphenhydrAMINE (BENADRYL) capsule 25 mg (not administered)    Labs Reviewed - No data to display  No results found.  No diagnosis found.  MDM  Has been seen in the ED within the past month and has made no mention of headache in the past.  Well appearing sitting comfortably in room with lights on.  No indication for imaging or LP at this time.  Follow up with your PMD    I personally performed the services described in this documentation, which was scribed in my presence. The recorded information has been reviewed and is accurate.    Jasmine Awe, MD 03/14/13 [phone]

## 2013-03-14 MED ORDER — IBUPROFEN 800 MG PO TABS: 800.0000 mg | ORAL_TABLET | Freq: Three times a day (TID) | ORAL | Status: DC

## 2013-03-14 MED ORDER — DIPHENHYDRAMINE HCL 25 MG PO CAPS
25.0000 mg | ORAL_CAPSULE | Freq: Once | ORAL | Status: AC
  Administered 2013-03-14: 25 mg via ORAL
  Filled 2013-03-14: qty 1

## 2013-03-14 MED ORDER — PROMETHAZINE HCL 25 MG PO TABS
ORAL_TABLET | ORAL | Status: AC
  Administered 2013-03-14: 25 mg
  Filled 2013-03-14: qty 1

## 2013-03-14 MED ORDER — IBUPROFEN 800 MG PO TABS
800.0000 mg | ORAL_TABLET | Freq: Once | ORAL | Status: AC
  Administered 2013-03-14: 800 mg via ORAL
  Filled 2013-03-14: qty 1

## 2013-03-14 MED ORDER — METOCLOPRAMIDE HCL 10 MG PO TABS
10.0000 mg | ORAL_TABLET | Freq: Once | ORAL | Status: AC
  Administered 2013-03-14: 10 mg via ORAL
  Filled 2013-03-14: qty 1

## 2013-03-14 NOTE — ED Notes (Signed)
Medicated for continued headache per order Dr Nicanor Alcon

## 2013-04-25 DIAGNOSIS — F840 Autistic disorder: Secondary | ICD-10-CM | POA: Insufficient documentation

## 2013-04-25 DIAGNOSIS — Z8719 Personal history of other diseases of the digestive system: Secondary | ICD-10-CM | POA: Insufficient documentation

## 2013-04-25 DIAGNOSIS — Z79899 Other long term (current) drug therapy: Secondary | ICD-10-CM | POA: Insufficient documentation

## 2013-04-25 DIAGNOSIS — F329 Major depressive disorder, single episode, unspecified: Secondary | ICD-10-CM | POA: Insufficient documentation

## 2013-04-25 DIAGNOSIS — K219 Gastro-esophageal reflux disease without esophagitis: Secondary | ICD-10-CM | POA: Insufficient documentation

## 2013-04-25 DIAGNOSIS — F3289 Other specified depressive episodes: Secondary | ICD-10-CM | POA: Insufficient documentation

## 2013-04-25 DIAGNOSIS — E079 Disorder of thyroid, unspecified: Secondary | ICD-10-CM | POA: Insufficient documentation

## 2013-04-25 DIAGNOSIS — R1011 Right upper quadrant pain: Secondary | ICD-10-CM | POA: Insufficient documentation

## 2013-04-25 DIAGNOSIS — J45909 Unspecified asthma, uncomplicated: Secondary | ICD-10-CM | POA: Insufficient documentation

## 2013-04-25 DIAGNOSIS — Z8659 Personal history of other mental and behavioral disorders: Secondary | ICD-10-CM | POA: Insufficient documentation

## 2013-04-25 DIAGNOSIS — Z3202 Encounter for pregnancy test, result negative: Secondary | ICD-10-CM | POA: Insufficient documentation

## 2013-04-25 DIAGNOSIS — N390 Urinary tract infection, site not specified: Secondary | ICD-10-CM | POA: Insufficient documentation

## 2013-04-25 DIAGNOSIS — IMO0002 Reserved for concepts with insufficient information to code with codable children: Secondary | ICD-10-CM | POA: Insufficient documentation

## 2013-04-26 ENCOUNTER — Encounter (HOSPITAL_BASED_OUTPATIENT_CLINIC_OR_DEPARTMENT_OTHER): Payer: Self-pay | Admitting: *Deleted

## 2013-04-26 ENCOUNTER — Emergency Department (HOSPITAL_BASED_OUTPATIENT_CLINIC_OR_DEPARTMENT_OTHER): Payer: PRIVATE HEALTH INSURANCE

## 2013-04-26 ENCOUNTER — Emergency Department (HOSPITAL_BASED_OUTPATIENT_CLINIC_OR_DEPARTMENT_OTHER)
Admission: EM | Admit: 2013-04-26 | Discharge: 2013-04-26 | Disposition: A | Discharge status: Home / Self Care | Payer: PRIVATE HEALTH INSURANCE | Attending: Emergency Medicine | Admitting: Emergency Medicine

## 2013-04-26 DIAGNOSIS — N390 Urinary tract infection, site not specified: Secondary | ICD-10-CM

## 2013-04-26 DIAGNOSIS — K219 Gastro-esophageal reflux disease without esophagitis: Secondary | ICD-10-CM

## 2013-04-26 LAB — COMPREHENSIVE METABOLIC PANEL
BUN: 15 mg/dL (ref 6–23)
CO2: 22 mEq/L (ref 19–32)
Calcium: 9.3 mg/dL (ref 8.4–10.5)
Chloride: 108 mEq/L (ref 96–112)
Creatinine, Ser: 0.9 mg/dL (ref 0.50–1.10)
GFR calc Af Amer: >90 mL/min (ref >90)
GFR calc non Af Amer: >90 mL/min (ref >90)
Glucose, Bld: 98 mg/dL (ref 70–99)
Total Bilirubin: 0.2 mg/dL — ABNORMAL LOW (ref 0.3–1.2)

## 2013-04-26 LAB — CBC WITH DIFFERENTIAL/PLATELET
Basophils Absolute: 0 10*3/uL (ref 0.0–0.1)
Eosinophils Relative: 0 % (ref 0–5)
HCT: 37.5 % (ref 36.0–46.0)
Hemoglobin: 11.7 g/dL — ABNORMAL LOW (ref 12.0–15.0)
Lymphocytes Relative: 29 % (ref 12–46)
Lymphs Abs: 2.8 10*3/uL (ref 0.7–4.0)
MCV: 86 fL (ref 78.0–100.0)
Monocytes Absolute: 1.1 10*3/uL — ABNORMAL HIGH (ref 0.1–1.0)
Monocytes Relative: 12 % (ref 3–12)
RDW: 15.2 % (ref 11.5–15.5)
WBC: 9.6 10*3/uL (ref 4.0–10.5)

## 2013-04-26 LAB — URINALYSIS, ROUTINE W REFLEX MICROSCOPIC
Glucose, UA: NEGATIVE mg/dL
Hgb urine dipstick: NEGATIVE
Ketones, ur: NEGATIVE mg/dL
Protein, ur: NEGATIVE mg/dL

## 2013-04-26 LAB — URINE MICROSCOPIC-ADD ON

## 2013-04-26 LAB — PREGNANCY, URINE: Preg Test, Ur: NEGATIVE

## 2013-04-26 MED ORDER — NITROFURANTOIN MONOHYD MACRO 100 MG PO CAPS: 100.0000 mg | ORAL_CAPSULE | Freq: Two times a day (BID) | ORAL | Status: DC

## 2013-04-26 MED ORDER — SUCRALFATE 1 GM/10ML PO SUSP: 1.0000 g | Freq: Four times a day (QID) | ORAL | Status: DC

## 2013-04-26 MED ORDER — GI COCKTAIL ~~LOC~~
30.0000 mL | Freq: Once | ORAL | Status: AC
  Administered 2013-04-26: 30 mL via ORAL
  Filled 2013-04-26: qty 30

## 2013-04-26 NOTE — ED Notes (Signed)
Pt has had right upper quad pain that started the middle part of Aug. States she was to have a radiology procedure unsure of name to test her gallbladder but states her insurance has denied test and pt states she is in pain. Describes as a sharp and constant pain. C/o diarrhea. Denies any fevers. Denies any recent nausea. Denies any urinary frequency

## 2013-04-26 NOTE — ED Notes (Signed)
MD back at bedside for disposition 

## 2013-04-26 NOTE — ED Provider Notes (Signed)
CSN: [id number]     Arrival date & time 04/25/13  2332 History  This chart was scribed for Raymie Trani Smitty Cords, MD by Leone Payor, ED Scribe. This patient was seen in room MH04/MH04 and the patient's care was started 12:37 AM.    Chief Complaint  Patient presents with  . Abdominal Pain    Patient is a 22 y.o. female presenting with abdominal pain. The history is provided by the patient. No language interpreter was used.  Abdominal Pain Pain location:  RUQ Pain quality: sharp   Pain radiates to:  Does not radiate Pain severity:  Mild Duration:  2 weeks Timing:  Constant Progression:  Unchanged Chronicity:  New Context: not alcohol use   Relieved by:  Nothing Worsened by:  Nothing tried Ineffective treatments:  None tried Associated symptoms: no anorexia, no constipation, no diarrhea, no nausea and no vomiting   Risk factors: not pregnant     HPI Comments: Jasmine Hickman is a 22 y.o. female with past medical history of GERD, esophagitis, ulcerative colitis who presents to the Emergency Department complaining of constant, sharp RUQ pain that began a couple of weeks ago. Pt was seen by PCP and was referred to have a radiology procedure of the gallbladder but was denied by insurance. She denies nausea or emesis. Pt denies smoking and alcohol use.    Past Medical History  Diagnosis Date  . Autism   . Asthma   . Ulcerative colitis   . ADHD (attention deficit hyperactivity disorder)   . Esophagitis   . GERD (gastroesophageal reflux disease)   . Thyroid disease   . Depression    Past Surgical History  Procedure Laterality Date  . Knee arthroscopy     No family history on file. History  Substance Use Topics  . Smoking status: Never Smoker   . Smokeless tobacco: Never Used  . Alcohol Use: No   OB History   Grav Para Term Preterm Abortions TAB SAB Ect Mult Living                 Review of Systems  Gastrointestinal: Positive for abdominal pain. Negative for nausea,  vomiting, diarrhea, constipation and anorexia.  All other systems reviewed and are negative.    Allergies  Bee venom and Other  Home Medications   Current Outpatient Rx  Name  Route  Sig  Dispense  Refill  . baclofen (LIORESAL) 10 MG tablet   Oral   Take 10 mg by mouth 3 (three) times daily.         . ondansetron (ZOFRAN) 8 MG tablet   Oral   Take by mouth every 8 (eight) hours as needed for nausea.         . predniSONE (DELTASONE) 10 MG tablet   Oral   Take 10 mg by mouth 3 (three) times daily.         Marland Kitchen topiramate (TOPAMAX) 25 MG tablet   Oral   Take 25 mg by mouth 2 (two) times daily. 3 at night         . Alum & Mag Hydroxide-Simeth (GI COCKTAIL) SUSP suspension   Oral   Take 30 mLs by mouth as needed for indigestion. Shake well.         . Alum & Mag Hydroxide-Simeth (GI COCKTAIL) SUSP suspension   Oral   Take 30 mLs by mouth 3 (three) times daily as needed (for heartburn). Shake well.   300 mL   1   .  amoxicillin (AMOXIL) 500 MG tablet   Oral   Take 1,000 mg by mouth 2 (two) times daily.         Marland Kitchen HYDROcodone-acetaminophen (NORCO/VICODIN) 5-325 MG per tablet   Oral   Take 1 tablet by mouth every 6 (six) hours as needed for pain.         Marland Kitchen ibuprofen (ADVIL,MOTRIN) 800 MG tablet   Oral   Take 1 tablet (800 mg total) by mouth 3 (three) times daily.   21 tablet   0   . lansoprazole (PREVACID) 30 MG capsule   Oral   Take 30 mg by mouth 2 (two) times daily.         Marland Kitchen levocetirizine (XYZAL) 5 MG tablet   Oral   Take 5 mg by mouth every evening.         Marland Kitchen levothyroxine (SYNTHROID, LEVOTHROID) 88 MCG tablet   Oral   Take 88 mcg by mouth daily before breakfast.         . lidocaine (LIDODERM) 5 %   Transdermal   Place 1 patch onto the skin daily. Remove & Discard patch within 12 hours or as directed by MD   6 patch   0   . mesalamine (APRISO) 0.375 G 24 hr capsule   Oral   Take 375 mg by mouth 4 (four) times daily.         .  metoCLOPramide (REGLAN) 10 MG tablet   Oral   Take 1 tablet (10 mg total) by mouth every 8 (eight) hours as needed (for nausea).   10 tablet   0   . montelukast (SINGULAIR) 10 MG tablet   Oral   Take 10 mg by mouth at bedtime.         . ondansetron (ZOFRAN-ODT) 4 MG disintegrating tablet   Oral   Take 4 mg by mouth every 8 (eight) hours as needed for nausea.         . promethazine (PHENERGAN) 25 MG tablet   Oral   Take 25 mg by mouth every 6 (six) hours as needed for nausea.         . sucralfate (CARAFATE) 1 G tablet   Oral   Take 1 g by mouth 4 (four) times daily.         Marland Kitchen sulfamethoxazole-trimethoprim (SEPTRA DS) 800-160 MG per tablet   Oral   Take 1 tablet by mouth every 12 (twelve) hours.   10 tablet   0   . sulfamethoxazole-trimethoprim (SEPTRA DS) 800-160 MG per tablet   Oral   Take 1 tablet by mouth every 12 (twelve) hours.   10 tablet   0   . traMADol (ULTRAM) 50 MG tablet   Oral   Take 1 tablet (50 mg total) by mouth every 6 (six) hours as needed for pain.   15 tablet   0   . ziprasidone (GEODON) 60 MG capsule   Oral   Take 60 mg by mouth daily. Changed to daily          There were no vitals taken for this visit. Physical Exam  Nursing note and vitals reviewed. Constitutional: She is oriented to person, place, and time. She appears well-developed and well-nourished.  HENT:  Head: Normocephalic and atraumatic.  Mouth/Throat: Oropharynx is clear and moist. No oropharyngeal exudate.  Eyes: Pupils are equal, round, and reactive to light.  Neck: Normal range of motion. Neck supple.  Cardiovascular: Normal rate, regular rhythm and intact distal pulses.  Pulmonary/Chest: Effort normal and breath sounds normal. She has no wheezes. She has no rales.  Abdominal: Soft. Bowel sounds are normal. There is no tenderness. There is no rebound and no guarding.  Musculoskeletal: Normal range of motion.  Neurological: She is alert and oriented to person,  place, and time.  Skin: Skin is warm and dry.  Psychiatric: She has a normal mood and affect.    ED Course  Procedures (including critical care time)  DIAGNOSTIC STUDIES: Oxygen Saturation is 100% on RA, normal by my interpretation.    COORDINATION OF CARE: 12:36 AM Discussed treatment plan with pt at bedside and pt agreed to plan.   Labs Review Labs Reviewed  URINALYSIS, ROUTINE W REFLEX MICROSCOPIC  PREGNANCY, URINE   Imaging Review No results found.  MDM  No diagnosis found. Exam vitals and labs reassuring.  No indication for advanced imaging.  Suspect ongoing issues with GERD.  Will treat for UTI.  Follow up with your GI specialist.    I personally performed the services described in this documentation, which was scribed in my presence. The recorded information has been reviewed and is accurate.    Alecia Doi Smitty Cords, MD 04/26/13 [phone]

## 2013-04-26 NOTE — ED Notes (Signed)
Returned from xray, awaiting results.

## 2013-04-27 LAB — URINE CULTURE
Colony Count: NO GROWTH
Culture: NO GROWTH

## 2013-05-20 DIAGNOSIS — K30 Functional dyspepsia: Secondary | ICD-10-CM | POA: Insufficient documentation

## 2013-05-24 ENCOUNTER — Encounter (HOSPITAL_BASED_OUTPATIENT_CLINIC_OR_DEPARTMENT_OTHER): Payer: Self-pay | Admitting: *Deleted

## 2013-05-24 ENCOUNTER — Emergency Department (HOSPITAL_BASED_OUTPATIENT_CLINIC_OR_DEPARTMENT_OTHER): Payer: PRIVATE HEALTH INSURANCE

## 2013-05-24 ENCOUNTER — Emergency Department (HOSPITAL_BASED_OUTPATIENT_CLINIC_OR_DEPARTMENT_OTHER)
Admission: EM | Admit: 2013-05-24 | Discharge: 2013-05-25 | Disposition: A | Discharge status: Home / Self Care | Payer: PRIVATE HEALTH INSURANCE | Attending: Emergency Medicine | Admitting: Emergency Medicine

## 2013-05-24 DIAGNOSIS — K219 Gastro-esophageal reflux disease without esophagitis: Secondary | ICD-10-CM | POA: Insufficient documentation

## 2013-05-24 DIAGNOSIS — E079 Disorder of thyroid, unspecified: Secondary | ICD-10-CM | POA: Insufficient documentation

## 2013-05-24 DIAGNOSIS — F840 Autistic disorder: Secondary | ICD-10-CM | POA: Insufficient documentation

## 2013-05-24 DIAGNOSIS — M25539 Pain in unspecified wrist: Secondary | ICD-10-CM | POA: Insufficient documentation

## 2013-05-24 DIAGNOSIS — I82619 Acute embolism and thrombosis of superficial veins of unspecified upper extremity: Secondary | ICD-10-CM

## 2013-05-24 DIAGNOSIS — Z792 Long term (current) use of antibiotics: Secondary | ICD-10-CM | POA: Insufficient documentation

## 2013-05-24 DIAGNOSIS — Z79899 Other long term (current) drug therapy: Secondary | ICD-10-CM | POA: Insufficient documentation

## 2013-05-24 DIAGNOSIS — IMO0002 Reserved for concepts with insufficient information to code with codable children: Secondary | ICD-10-CM | POA: Insufficient documentation

## 2013-05-24 DIAGNOSIS — J45909 Unspecified asthma, uncomplicated: Secondary | ICD-10-CM | POA: Insufficient documentation

## 2013-05-24 NOTE — ED Notes (Signed)
Pt was pt at [facility] last week- had IV in right arm- arm has reddened bruised area- d/c home Friday afternoon

## 2013-05-24 NOTE — ED Notes (Signed)
Patient transported to ultrasound ambulatory with tech. 

## 2013-05-24 NOTE — ED Provider Notes (Signed)
CSN: [id number]     Arrival date & time 05/24/13  2026 History   First MD Initiated Contact with Patient 05/24/13 2239     Chief Complaint  Patient presents with  . Arm Pain   (Consider location/radiation/quality/duration/timing/severity/associated sxs/prior Treatment) Patient is a 22 y.o. female presenting with arm injury. The history is provided by the patient. No language interpreter was used.  Arm Injury Location:  Elbow and arm Time since incident:  6 days Injury: yes   Mechanism of injury comment:  Iv sticks Arm location:  R arm and R forearm Elbow location:  R elbow Pain details:    Quality:  Aching   Radiates to:  R arm   Severity:  Moderate   Onset quality:  Gradual   Duration:  6 days   Timing:  Constant   Progression:  Worsening Chronicity:  New Ineffective treatments:  None tried Pt stuck multiple times.  Pt complains of swelling above Iv sticks.  Arm is painful and swolle  Past Medical History  Diagnosis Date  . Autism   . Asthma   . Ulcerative colitis   . ADHD (attention deficit hyperactivity disorder)   . Esophagitis   . GERD (gastroesophageal reflux disease)   . Thyroid disease   . Depression    Past Surgical History  Procedure Laterality Date  . Knee arthroscopy     No family history on file. History  Substance Use Topics  . Smoking status: Never Smoker   . Smokeless tobacco: Never Used  . Alcohol Use: No   OB History   Grav Para Term Preterm Abortions TAB SAB Ect Mult Living                 Review of Systems  Musculoskeletal: Positive for joint swelling.  All other systems reviewed and are negative.    Allergies  Bee venom and Other  Home Medications   Current Outpatient Rx  Name  Route  Sig  Dispense  Refill  . Alum & Mag Hydroxide-Simeth (GI COCKTAIL) SUSP suspension   Oral   Take 30 mLs by mouth as needed for indigestion. Shake well.         . Alum & Mag Hydroxide-Simeth (GI COCKTAIL) SUSP suspension   Oral   Take 30 mLs  by mouth 3 (three) times daily as needed (for heartburn). Shake well.   300 mL   1   . amoxicillin (AMOXIL) 500 MG tablet   Oral   Take 1,000 mg by mouth 2 (two) times daily.         . baclofen (LIORESAL) 10 MG tablet   Oral   Take 10 mg by mouth 3 (three) times daily.         Marland Kitchen HYDROcodone-acetaminophen (NORCO/VICODIN) 5-325 MG per tablet   Oral   Take 1 tablet by mouth every 6 (six) hours as needed for pain.         Marland Kitchen ibuprofen (ADVIL,MOTRIN) 800 MG tablet   Oral   Take 1 tablet (800 mg total) by mouth 3 (three) times daily.   21 tablet   0   . lansoprazole (PREVACID) 30 MG capsule   Oral   Take 30 mg by mouth 2 (two) times daily.         Marland Kitchen levocetirizine (XYZAL) 5 MG tablet   Oral   Take 5 mg by mouth every evening.         Marland Kitchen levothyroxine (SYNTHROID, LEVOTHROID) 88 MCG tablet   Oral  Take 88 mcg by mouth daily before breakfast.         . lidocaine (LIDODERM) 5 %   Transdermal   Place 1 patch onto the skin daily. Remove & Discard patch within 12 hours or as directed by MD   6 patch   0   . mesalamine (APRISO) 0.375 G 24 hr capsule   Oral   Take 375 mg by mouth 4 (four) times daily.         . metoCLOPramide (REGLAN) 10 MG tablet   Oral   Take 1 tablet (10 mg total) by mouth every 8 (eight) hours as needed (for nausea).   10 tablet   0   . montelukast (SINGULAIR) 10 MG tablet   Oral   Take 10 mg by mouth at bedtime.         . nitrofurantoin, macrocrystal-monohydrate, (MACROBID) 100 MG capsule   Oral   Take 1 capsule (100 mg total) by mouth 2 (two) times daily. X 7 days   14 capsule   0   . ondansetron (ZOFRAN) 8 MG tablet   Oral   Take by mouth every 8 (eight) hours as needed for nausea.         . ondansetron (ZOFRAN-ODT) 4 MG disintegrating tablet   Oral   Take 4 mg by mouth every 8 (eight) hours as needed for nausea.         . predniSONE (DELTASONE) 10 MG tablet   Oral   Take 10 mg by mouth 3 (three) times daily.          . promethazine (PHENERGAN) 25 MG tablet   Oral   Take 25 mg by mouth every 6 (six) hours as needed for nausea.         . sucralfate (CARAFATE) 1 G tablet   Oral   Take 1 g by mouth 4 (four) times daily.         . sucralfate (CARAFATE) 1 GM/10ML suspension   Oral   Take 10 mLs (1 g total) by mouth 4 (four) times daily.   420 mL   0   . sulfamethoxazole-trimethoprim (SEPTRA DS) 800-160 MG per tablet   Oral   Take 1 tablet by mouth every 12 (twelve) hours.   10 tablet   0   . sulfamethoxazole-trimethoprim (SEPTRA DS) 800-160 MG per tablet   Oral   Take 1 tablet by mouth every 12 (twelve) hours.   10 tablet   0   . topiramate (TOPAMAX) 25 MG tablet   Oral   Take 25 mg by mouth 2 (two) times daily. 3 at night         . traMADol (ULTRAM) 50 MG tablet   Oral   Take 1 tablet (50 mg total) by mouth every 6 (six) hours as needed for pain.   15 tablet   0   . ziprasidone (GEODON) 60 MG capsule   Oral   Take 60 mg by mouth daily. Changed to daily          BP 138/83  Pulse 85  Temp(Src) 97.8 F (36.6 C) (Oral)  Resp 20  Ht 5\' 7"  (1.702 m)  Wt 288 lb (130.636 kg)  BMI 45.1 kg/m2  SpO2 99% Physical Exam  Nursing note and vitals reviewed. Constitutional: She is oriented to person, place, and time. She appears well-developed and well-nourished.  HENT:  Head: Normocephalic.  Right Ear: External ear normal.  Left Ear: External ear normal.  Eyes: Pupils  are equal, round, and reactive to light.  Neck: Normal range of motion.  Cardiovascular: Normal rate.   Musculoskeletal: She exhibits tenderness.  Swollen right arm,  Diffusely tender,  nv and ns intact  Neurological: She is alert and oriented to person, place, and time. She has normal reflexes.  Skin: Skin is warm.  Psychiatric: She has a normal mood and affect.    ED Course  Procedures (including critical care time) Labs Review Labs Reviewed - No data to display Imaging Review No results found.  MDM   No diagnosis found.  Pt has a partial occlusion of cephalic vein.  No deep vein thrombus   Elson Areas, PA-C 05/25/13 0110

## 2013-05-25 ENCOUNTER — Emergency Department (HOSPITAL_BASED_OUTPATIENT_CLINIC_OR_DEPARTMENT_OTHER): Payer: PRIVATE HEALTH INSURANCE

## 2013-05-25 LAB — CBC WITH DIFFERENTIAL/PLATELET
Basophils Relative: 0 % (ref 0–1)
Eosinophils Relative: 1 % (ref 0–5)
Hemoglobin: 13.2 g/dL (ref 12.0–15.0)
Lymphocytes Relative: 27 % (ref 12–46)
Monocytes Absolute: 0.9 10*3/uL (ref 0.1–1.0)
Neutrophils Relative %: 63 % (ref 43–77)
Platelets: 258 10*3/uL (ref 150–400)
RBC: 4.92 MIL/uL (ref 3.87–5.11)
WBC: 9.9 10*3/uL (ref 4.0–10.5)

## 2013-05-25 LAB — BASIC METABOLIC PANEL
CO2: 23 mEq/L (ref 19–32)
Calcium: 10 mg/dL (ref 8.4–10.5)
GFR calc non Af Amer: >90 mL/min (ref >90)
Glucose, Bld: 104 mg/dL — ABNORMAL HIGH (ref 70–99)
Potassium: 3.9 mEq/L (ref 3.5–5.1)
Sodium: 138 mEq/L (ref 135–145)

## 2013-05-25 MED ORDER — SODIUM CHLORIDE 0.9 % IV SOLN
Freq: Once | INTRAVENOUS | Status: AC
  Administered 2013-05-25: 1000 mL via INTRAVENOUS

## 2013-05-25 MED ORDER — IOHEXOL 350 MG/ML SOLN
80.0000 mL | Freq: Once | INTRAVENOUS | Status: AC | PRN
  Administered 2013-05-25: 80 mL via INTRAVENOUS

## 2013-05-25 NOTE — ED Provider Notes (Addendum)
Medical screening examination/treatment/procedure(s) were conducted as a shared visit with non-physician practitioner(s) and myself.  I personally evaluated the patient during the encounter. Patient is a 22 year old female with history of autism, asthma, acid reflux. She was seen at [facility] several days ago for evaluation of vomiting. She had multiple sticks in her right arm to attempt to place an IV. Since she was discharged from there she is developed increasing pain of the right upper extremity in the region of the biceps. She denies fevers or chills. She does report some shortness of breath, however she does have a history of asthma.  On exam the vitals are stable and the patient is afebrile. Oxygen saturations are 99% on room air. She is awake alert and oriented x4. Heart is regular rate and rhythm without murmurs. Lungs are clear without wheezes or rales. Abdomen is benign. The right upper extremity is noted to have a hematoma over the mid biceps. The distal pulses are easily palpable and strength and motor are intact to the right hand.  Workup in the emergency department reveals a partial occlusion of the right cephalic vein on ultrasound. As she was complaining of mild shortness of breath, CT scan of the chest was performed which was negative for pulmonary embolism. I discussed the results of the ultrasound with the radiologist and was informed that the cephalic vein is indeed a superficial vein that does not require anticoagulation. She will be discharged to home with instructions to take NSAIDs and to apply heat to the area several times daily. She is instructed to return if her symptoms worsen or she develops shortness of breath or chest pain. She is to followup with her primary doctor in one week.  Geoffery Lyons, MD 05/25/13 1610  Geoffery Lyons, MD 05/25/13 [phone]

## 2013-05-25 NOTE — ED Notes (Signed)
MD at bedside. 

## 2013-05-25 NOTE — ED Notes (Signed)
Patient transported to ct via stretcher per tech. 

## 2013-05-25 NOTE — ED Notes (Signed)
Pt back from [facility]

## 2013-05-25 NOTE — ED Notes (Signed)
Pt back from ct

## 2013-05-26 ENCOUNTER — Emergency Department (HOSPITAL_BASED_OUTPATIENT_CLINIC_OR_DEPARTMENT_OTHER)
Admission: EM | Admit: 2013-05-26 | Discharge: 2013-05-26 | Disposition: A | Discharge status: Home / Self Care | Payer: PRIVATE HEALTH INSURANCE | Attending: Emergency Medicine | Admitting: Emergency Medicine

## 2013-05-26 ENCOUNTER — Encounter (HOSPITAL_BASED_OUTPATIENT_CLINIC_OR_DEPARTMENT_OTHER): Payer: Self-pay | Admitting: Emergency Medicine

## 2013-05-26 DIAGNOSIS — Z792 Long term (current) use of antibiotics: Secondary | ICD-10-CM | POA: Insufficient documentation

## 2013-05-26 DIAGNOSIS — F3289 Other specified depressive episodes: Secondary | ICD-10-CM | POA: Insufficient documentation

## 2013-05-26 DIAGNOSIS — F840 Autistic disorder: Secondary | ICD-10-CM | POA: Insufficient documentation

## 2013-05-26 DIAGNOSIS — K219 Gastro-esophageal reflux disease without esophagitis: Secondary | ICD-10-CM | POA: Insufficient documentation

## 2013-05-26 DIAGNOSIS — I809 Phlebitis and thrombophlebitis of unspecified site: Secondary | ICD-10-CM

## 2013-05-26 DIAGNOSIS — Z79899 Other long term (current) drug therapy: Secondary | ICD-10-CM | POA: Insufficient documentation

## 2013-05-26 DIAGNOSIS — IMO0002 Reserved for concepts with insufficient information to code with codable children: Secondary | ICD-10-CM | POA: Insufficient documentation

## 2013-05-26 DIAGNOSIS — F329 Major depressive disorder, single episode, unspecified: Secondary | ICD-10-CM | POA: Insufficient documentation

## 2013-05-26 DIAGNOSIS — E079 Disorder of thyroid, unspecified: Secondary | ICD-10-CM | POA: Insufficient documentation

## 2013-05-26 DIAGNOSIS — I808 Phlebitis and thrombophlebitis of other sites: Secondary | ICD-10-CM | POA: Insufficient documentation

## 2013-05-26 DIAGNOSIS — J45909 Unspecified asthma, uncomplicated: Secondary | ICD-10-CM | POA: Insufficient documentation

## 2013-05-26 HISTORY — DX: Acute embolism and thrombosis of superficial veins of right upper extremity: I82.611

## 2013-05-26 NOTE — ED Notes (Signed)
Pt c/o pain in right arm. Pt dx with DVT in right arm 9/28 here

## 2013-05-26 NOTE — ED Provider Notes (Signed)
CSN: [id number]     Arrival date & time 05/26/13  2228 History  This chart was scribed for Charles B. Bernette Mayers, MD by Karle Plumber, ED Scribe. This patient was seen in room MH04/MH04 and the patient's care was started at 10:56 PM.   Chief Complaint  Patient presents with  . Extremity Pain   The history is provided by the patient. No language interpreter was used.   HPI Comments:  Jasmine Hickman is a 22 y.o. female with h/o recent diagnosed clot in superficial vein of RUE who presents to the Emergency Department complaining of moderate, constant right arm pain due to same. Pt states she took Ibuprofen 800 mg with no relief. She denies CP and SOB. Pt states her PCP is Dr. Primus Bravo.  Past Medical History  Diagnosis Date  . Autism   . Asthma   . Ulcerative colitis   . ADHD (attention deficit hyperactivity disorder)   . Esophagitis   . GERD (gastroesophageal reflux disease)   . Thyroid disease   . Depression   . Acute thrombosis of right cephalic vein    Past Surgical History  Procedure Laterality Date  . Knee arthroscopy     No family history on file. History  Substance Use Topics  . Smoking status: Never Smoker   . Smokeless tobacco: Never Used  . Alcohol Use: No   OB History   Grav Para Term Preterm Abortions TAB SAB Ect Mult Living                 Review of Systems  All other systems reviewed and are negative.   Allergies  Bee venom and Other  Home Medications   Current Outpatient Rx  Name  Route  Sig  Dispense  Refill  . Alum & Mag Hydroxide-Simeth (GI COCKTAIL) SUSP suspension   Oral   Take 30 mLs by mouth as needed for indigestion. Shake well.         . Alum & Mag Hydroxide-Simeth (GI COCKTAIL) SUSP suspension   Oral   Take 30 mLs by mouth 3 (three) times daily as needed (for heartburn). Shake well.   300 mL   1   . amoxicillin (AMOXIL) 500 MG tablet   Oral   Take 1,000 mg by mouth 2 (two) times daily.         . baclofen (LIORESAL) 10 MG  tablet   Oral   Take 10 mg by mouth 3 (three) times daily.         Marland Kitchen HYDROcodone-acetaminophen (NORCO/VICODIN) 5-325 MG per tablet   Oral   Take 1 tablet by mouth every 6 (six) hours as needed for pain.         Marland Kitchen ibuprofen (ADVIL,MOTRIN) 800 MG tablet   Oral   Take 1 tablet (800 mg total) by mouth 3 (three) times daily.   21 tablet   0   . lansoprazole (PREVACID) 30 MG capsule   Oral   Take 30 mg by mouth 2 (two) times daily.         Marland Kitchen levocetirizine (XYZAL) 5 MG tablet   Oral   Take 5 mg by mouth every evening.         Marland Kitchen levothyroxine (SYNTHROID, LEVOTHROID) 88 MCG tablet   Oral   Take 88 mcg by mouth daily before breakfast.         . lidocaine (LIDODERM) 5 %   Transdermal   Place 1 patch onto the skin daily. Remove & Discard patch  within 12 hours or as directed by MD   6 patch   0   . mesalamine (APRISO) 0.375 G 24 hr capsule   Oral   Take 375 mg by mouth 4 (four) times daily.         . metoCLOPramide (REGLAN) 10 MG tablet   Oral   Take 1 tablet (10 mg total) by mouth every 8 (eight) hours as needed (for nausea).   10 tablet   0   . montelukast (SINGULAIR) 10 MG tablet   Oral   Take 10 mg by mouth at bedtime.         . nitrofurantoin, macrocrystal-monohydrate, (MACROBID) 100 MG capsule   Oral   Take 1 capsule (100 mg total) by mouth 2 (two) times daily. X 7 days   14 capsule   0   . ondansetron (ZOFRAN) 8 MG tablet   Oral   Take by mouth every 8 (eight) hours as needed for nausea.         . ondansetron (ZOFRAN-ODT) 4 MG disintegrating tablet   Oral   Take 4 mg by mouth every 8 (eight) hours as needed for nausea.         . predniSONE (DELTASONE) 10 MG tablet   Oral   Take 10 mg by mouth 3 (three) times daily.         . promethazine (PHENERGAN) 25 MG tablet   Oral   Take 25 mg by mouth every 6 (six) hours as needed for nausea.         . sucralfate (CARAFATE) 1 G tablet   Oral   Take 1 g by mouth 4 (four) times daily.          . sucralfate (CARAFATE) 1 GM/10ML suspension   Oral   Take 10 mLs (1 g total) by mouth 4 (four) times daily.   420 mL   0   . sulfamethoxazole-trimethoprim (SEPTRA DS) 800-160 MG per tablet   Oral   Take 1 tablet by mouth every 12 (twelve) hours.   10 tablet   0   . sulfamethoxazole-trimethoprim (SEPTRA DS) 800-160 MG per tablet   Oral   Take 1 tablet by mouth every 12 (twelve) hours.   10 tablet   0   . topiramate (TOPAMAX) 25 MG tablet   Oral   Take 25 mg by mouth 2 (two) times daily. 3 at night         . traMADol (ULTRAM) 50 MG tablet   Oral   Take 1 tablet (50 mg total) by mouth every 6 (six) hours as needed for pain.   15 tablet   0   . ziprasidone (GEODON) 60 MG capsule   Oral   Take 60 mg by mouth daily. Changed to daily          Triage Vitals: BP 128/79  Pulse 86  Temp(Src) 98.4 F (36.9 C) (Oral)  Resp 18  SpO2 100% Physical Exam  Nursing note and vitals reviewed. Constitutional: She is oriented to person, place, and time. She appears well-developed and well-nourished.  HENT:  Head: Normocephalic and atraumatic.  Eyes: EOM are normal. Pupils are equal, round, and reactive to light.  Neck: Normal range of motion. Neck supple.  Cardiovascular: Normal rate, normal heart sounds and intact distal pulses.   Pulmonary/Chest: Effort normal and breath sounds normal.  Abdominal: Bowel sounds are normal. She exhibits no distension. There is no tenderness.  Musculoskeletal: Normal range of motion. She exhibits no  edema and no tenderness.  Healing bruise to RUE. No significant tenderness or swelling.  Neurological: She is alert and oriented to person, place, and time. She has normal strength. No cranial nerve deficit or sensory deficit.  Skin: Skin is warm and dry. No rash noted.  Psychiatric: She has a normal mood and affect.    ED Course  Procedures (including critical care time) DIAGNOSTIC STUDIES: Oxygen Saturation is 100% on RA, normal by my  interpretation.   COORDINATION OF CARE: 11:01 PM- Advised pt to do warm compresses and take ibuprofen and Tylenol for the pain. Pt verbalizes understanding and agrees to plan.  Medications - No data to display  Labs Review Labs Reviewed - No data to display Imaging Review Ct Angio Chest Pe W/cm &/or Wo Cm  05/25/2013   CLINICAL DATA:  Right arm pain, chest pain.  EXAM: CT ANGIOGRAPHY CHEST WITH CONTRAST  TECHNIQUE: Multidetector CT imaging of the chest was performed using the standard protocol during bolus administration of intravenous contrast. Multiplanar CT image reconstructions including MIPs were obtained to evaluate the vascular anatomy.  CONTRAST:  80mL OMNIPAQUE IOHEXOL 350 MG/ML SOLN  COMPARISON:  None.  FINDINGS: No filling defects in the pulmonary arteries to suggest pulmonary emboli. Heart is normal size. Aorta is normal caliber. No mediastinal, hilar, or axillary adenopathy. Visualized thyroid and chest wall soft tissues unremarkable. Lungs are clear. No focal airspace opacitiesor suspicious nodules. No effusions. Imaging into the upper abdomen shows no acute findings. No acute bony abnormality.  Review of the MIP images confirms the above findings.  IMPRESSION: No acute findings.   Electronically Signed   By: Charlett Nose M.D.   On: 05/25/2013 02:04   US Venous Img Upper Uni Right  05/25/2013   CLINICAL DATA:  Right upper extremity swelling.  EXAM: RIGHT UPPER EXTREMITY VENOUS ULTRASOUND  TECHNIQUE: Gray-scale sonography with graded compression, as well as color Doppler and duplex ultrasound were performed to evaluate the upper extremity deep venous system from the level of the subclavian vein and including the jugular, axillary, basilic and upper cephalic vein. Spectral Doppler was utilized to evaluate flow at rest and with distal augmentation maneuvers.  COMPARISON:  None.  FINDINGS: Thrombus within deep veins: Partially occluded cephalic vein. The subclavian vein, axillary vein,  basilic vein and brachial veins are patent. The right IJ is patent.  Compressibility of deep veins:  Normal.  Duplex waveform respiratory phasicity:  Normal.  Duplex waveform response to augmentation:  Normal.  Venous reflux:  None visualized.  Other findings:  None visualized.  IMPRESSION: Partial occlusion of the cephalic vein. The other venous structures are patent and compressible.   Electronically Signed   By: Loralie Champagne M.D.   On: 05/25/2013 00:29    MDM   1. Superficial thrombophlebitis     Imaging results above from recent ED visit. No concern PE. Clot is superficial from recent IV sticks. Advised she will need f/u in PCP office later this week for recheck and consideration of repeat US but that it is too soon now. Advised aching pain is to be expected. Motrin/APAP, warm compress etc recommended.   I personally performed the services described in this documentation, which was scribed in my presence. The recorded information has been reviewed and is accurate.    Charles B. Bernette Mayers, MD 05/26/13 2316

## 2013-06-09 DIAGNOSIS — M25362 Other instability, left knee: Secondary | ICD-10-CM | POA: Insufficient documentation

## 2013-07-27 DIAGNOSIS — K2210 Ulcer of esophagus without bleeding: Secondary | ICD-10-CM | POA: Insufficient documentation

## 2013-08-31 ENCOUNTER — Emergency Department (INDEPENDENT_AMBULATORY_CARE_PROVIDER_SITE_OTHER): Payer: 59

## 2013-08-31 ENCOUNTER — Emergency Department (INDEPENDENT_AMBULATORY_CARE_PROVIDER_SITE_OTHER)
Admission: EM | Admit: 2013-08-31 | Discharge: 2013-08-31 | Disposition: A | Discharge status: Home / Self Care | Payer: 59 | Source: Home / Self Care | Attending: Emergency Medicine | Admitting: Emergency Medicine

## 2013-08-31 ENCOUNTER — Encounter: Payer: Self-pay | Admitting: Emergency Medicine

## 2013-08-31 DIAGNOSIS — M545 Low back pain, unspecified: Secondary | ICD-10-CM

## 2013-08-31 MED ORDER — MELOXICAM 7.5 MG PO TABS: 7.5000 mg | ORAL_TABLET | Freq: Two times a day (BID) | ORAL | Status: DC | PRN

## 2013-08-31 MED ORDER — CYCLOBENZAPRINE HCL 10 MG PO TABS: 10.0000 mg | ORAL_TABLET | Freq: Three times a day (TID) | ORAL | Status: DC | PRN

## 2013-08-31 MED ORDER — PREDNISONE 20 MG PO TABS: 20.0000 mg | ORAL_TABLET | Freq: Two times a day (BID) | ORAL | Status: DC

## 2013-08-31 NOTE — ED Provider Notes (Signed)
CSN: [id number]     Arrival date & time 08/31/13  1602 History   First MD Initiated Contact with Patient 08/31/13 1655     Chief Complaint  Patient presents with  . Back Pain   (Consider location/radiation/quality/duration/timing/severity/associated sxs/prior Treatment) HPI The patient presents today with back pain. Location: lumbar back, R>L Timing: 2 days Description: tight, spasm, moderate (mom says she may be exaggerating pain level).  Lifting boxes while moving into a new apartment. Worse with:  movement Better with: rest  Trauma: no Bladder/bowel incontinence: no Weakness: no Fever/chills: no Night pain: no Unexplained weight loss: no Cancer/immunosuppression: no PMH of osteoporosis or chronic steroid use:  no   Past Medical History  Diagnosis Date  . Autism   . Asthma   . Ulcerative colitis   . ADHD (attention deficit hyperactivity disorder)   . Esophagitis   . GERD (gastroesophageal reflux disease)   . Thyroid disease   . Depression   . Acute thrombosis of right cephalic vein    Past Surgical History  Procedure Laterality Date  . Knee arthroscopy     Family History  Problem Relation Age of Onset  . Hyperlipidemia Mother   . Thyroid disease Mother   . Diabetes Mother   . Diabetes Father    History  Substance Use Topics  . Smoking status: Never Smoker   . Smokeless tobacco: Never Used  . Alcohol Use: No   OB History   Grav Para Term Preterm Abortions TAB SAB Ect Mult Living                 Review of Systems  All other systems reviewed and are negative.    Allergies  Bee venom and Other  Home Medications   Current Outpatient Rx  Name  Route  Sig  Dispense  Refill  . Alum & Mag Hydroxide-Simeth (GI COCKTAIL) SUSP suspension   Oral   Take 30 mLs by mouth as needed for indigestion. Shake well.         . Alum & Mag Hydroxide-Simeth (GI COCKTAIL) SUSP suspension   Oral   Take 30 mLs by mouth 3 (three) times daily as needed (for heartburn).  Shake well.   300 mL   1   . amoxicillin (AMOXIL) 500 MG tablet   Oral   Take 1,000 mg by mouth 2 (two) times daily.         . baclofen (LIORESAL) 10 MG tablet   Oral   Take 10 mg by mouth 3 (three) times daily.         . cyclobenzaprine (FLEXERIL) 10 MG tablet   Oral   Take 1 tablet (10 mg total) by mouth 3 (three) times daily as needed for muscle spasms.   18 tablet   0   . HYDROcodone-acetaminophen (NORCO/VICODIN) 5-325 MG per tablet   Oral   Take 1 tablet by mouth every 6 (six) hours as needed for pain.         Marland Kitchen ibuprofen (ADVIL,MOTRIN) 800 MG tablet   Oral   Take 1 tablet (800 mg total) by mouth 3 (three) times daily.   21 tablet   0   . lansoprazole (PREVACID) 30 MG capsule   Oral   Take 30 mg by mouth 2 (two) times daily.         Marland Kitchen levocetirizine (XYZAL) 5 MG tablet   Oral   Take 5 mg by mouth every evening.         Marland Kitchen  levothyroxine (SYNTHROID, LEVOTHROID) 88 MCG tablet   Oral   Take 88 mcg by mouth daily before breakfast.         . lidocaine (LIDODERM) 5 %   Transdermal   Place 1 patch onto the skin daily. Remove & Discard patch within 12 hours or as directed by MD   6 patch   0   . meloxicam (MOBIC) 7.5 MG tablet   Oral   Take 1 tablet (7.5 mg total) by mouth 2 (two) times daily as needed for pain.   30 tablet   0   . mesalamine (APRISO) 0.375 G 24 hr capsule   Oral   Take 375 mg by mouth 4 (four) times daily.         . metoCLOPramide (REGLAN) 10 MG tablet   Oral   Take 1 tablet (10 mg total) by mouth every 8 (eight) hours as needed (for nausea).   10 tablet   0   . montelukast (SINGULAIR) 10 MG tablet   Oral   Take 10 mg by mouth at bedtime.         . nitrofurantoin, macrocrystal-monohydrate, (MACROBID) 100 MG capsule   Oral   Take 1 capsule (100 mg total) by mouth 2 (two) times daily. X 7 days   14 capsule   0   . ondansetron (ZOFRAN) 8 MG tablet   Oral   Take by mouth every 8 (eight) hours as needed for nausea.          . ondansetron (ZOFRAN-ODT) 4 MG disintegrating tablet   Oral   Take 4 mg by mouth every 8 (eight) hours as needed for nausea.         . predniSONE (DELTASONE) 10 MG tablet   Oral   Take 10 mg by mouth 3 (three) times daily.         . predniSONE (DELTASONE) 20 MG tablet   Oral   Take 1 tablet (20 mg total) by mouth 2 (two) times daily with a meal.   6 tablet   0   . promethazine (PHENERGAN) 25 MG tablet   Oral   Take 25 mg by mouth every 6 (six) hours as needed for nausea.         . sucralfate (CARAFATE) 1 G tablet   Oral   Take 1 g by mouth 4 (four) times daily.         . sucralfate (CARAFATE) 1 GM/10ML suspension   Oral   Take 10 mLs (1 g total) by mouth 4 (four) times daily.   420 mL   0   . sulfamethoxazole-trimethoprim (SEPTRA DS) 800-160 MG per tablet   Oral   Take 1 tablet by mouth every 12 (twelve) hours.   10 tablet   0   . sulfamethoxazole-trimethoprim (SEPTRA DS) 800-160 MG per tablet   Oral   Take 1 tablet by mouth every 12 (twelve) hours.   10 tablet   0   . topiramate (TOPAMAX) 25 MG tablet   Oral   Take 25 mg by mouth 2 (two) times daily. 3 at night         . traMADol (ULTRAM) 50 MG tablet   Oral   Take 1 tablet (50 mg total) by mouth every 6 (six) hours as needed for pain.   15 tablet   0   . ziprasidone (GEODON) 60 MG capsule   Oral   Take 60 mg by mouth daily. Changed to daily  BP 131/82  Pulse 92  Temp(Src) 98 F (36.7 C) (Oral)  Ht 5\' 7"  (1.702 m)  Wt 288 lb (130.636 kg)  BMI 45.10 kg/m2  SpO2 100% Physical Exam  Nursing note and vitals reviewed. Constitutional: She is oriented to person, place, and time. She appears well-developed and well-nourished.  HENT:  Head: Normocephalic and atraumatic.  Eyes: No scleral icterus.  Neck: Neck supple.  Cardiovascular: Regular rhythm and normal heart sounds.   Pulmonary/Chest: Effort normal and breath sounds normal. No respiratory distress.  Musculoskeletal:        Lumbar back: She exhibits tenderness, pain and spasm. She exhibits no bony tenderness.  SLR negative bilateral, symptoms R>L, paralumbar tenderness  Neurological: She is alert and oriented to person, place, and time. She has normal strength and normal reflexes. No sensory deficit.  Skin: Skin is warm and dry. No rash noted.  Psychiatric: She has a normal mood and affect. Her speech is normal.    ED Course  Procedures (including critical care time) Labs Review Labs Reviewed - No data to display Imaging Review Dg Lumbar Spine 2-3 Views  08/31/2013   CLINICAL DATA:  Low back pain with right-sided radicular symptoms  EXAM: LUMBAR SPINE - 2-3 VIEW  COMPARISON:  None.  FINDINGS: Frontal, lateral, and spot lumbosacral lateral images were obtained. There are 5 non-rib-bearing lumbar type vertebral bodies. There is no fracture or spondylolisthesis. Disc spaces appear intact. No erosive change.  IMPRESSION: No fracture or appreciable arthropathic change.   Electronically Signed   By: Bretta Bang M.D.   On: 08/31/2013 17:21     MDM   1. Pain in lower back    Xray was obtained and read by the radiologist as above.  A few Rx given including Mobic, Prednisone, and Flexeril.  Avoid heavy lifting, pushing, pulling for 1-2 weeks.  Heating pad.    Marlaine Hind, MD 08/31/13 1754

## 2013-08-31 NOTE — ED Notes (Signed)
LBP x 1 day while lifting a box yesterday heard a pop, radiates into right hip

## 2013-10-01 DIAGNOSIS — E236 Other disorders of pituitary gland: Secondary | ICD-10-CM | POA: Insufficient documentation

## 2013-10-07 ENCOUNTER — Encounter (HOSPITAL_BASED_OUTPATIENT_CLINIC_OR_DEPARTMENT_OTHER): Payer: Self-pay | Admitting: Emergency Medicine

## 2013-10-07 ENCOUNTER — Emergency Department (HOSPITAL_BASED_OUTPATIENT_CLINIC_OR_DEPARTMENT_OTHER): Payer: PRIVATE HEALTH INSURANCE

## 2013-10-07 ENCOUNTER — Emergency Department (HOSPITAL_BASED_OUTPATIENT_CLINIC_OR_DEPARTMENT_OTHER)
Admission: EM | Admit: 2013-10-07 | Discharge: 2013-10-07 | Disposition: A | Discharge status: Home / Self Care | Payer: PRIVATE HEALTH INSURANCE | Attending: Emergency Medicine | Admitting: Emergency Medicine

## 2013-10-07 DIAGNOSIS — K219 Gastro-esophageal reflux disease without esophagitis: Secondary | ICD-10-CM | POA: Insufficient documentation

## 2013-10-07 DIAGNOSIS — J45909 Unspecified asthma, uncomplicated: Secondary | ICD-10-CM | POA: Insufficient documentation

## 2013-10-07 DIAGNOSIS — F329 Major depressive disorder, single episode, unspecified: Secondary | ICD-10-CM | POA: Insufficient documentation

## 2013-10-07 DIAGNOSIS — J40 Bronchitis, not specified as acute or chronic: Secondary | ICD-10-CM

## 2013-10-07 DIAGNOSIS — F840 Autistic disorder: Secondary | ICD-10-CM | POA: Insufficient documentation

## 2013-10-07 DIAGNOSIS — F3289 Other specified depressive episodes: Secondary | ICD-10-CM | POA: Insufficient documentation

## 2013-10-07 DIAGNOSIS — IMO0002 Reserved for concepts with insufficient information to code with codable children: Secondary | ICD-10-CM | POA: Insufficient documentation

## 2013-10-07 DIAGNOSIS — E079 Disorder of thyroid, unspecified: Secondary | ICD-10-CM | POA: Insufficient documentation

## 2013-10-07 DIAGNOSIS — F909 Attention-deficit hyperactivity disorder, unspecified type: Secondary | ICD-10-CM | POA: Insufficient documentation

## 2013-10-07 DIAGNOSIS — Z79899 Other long term (current) drug therapy: Secondary | ICD-10-CM | POA: Insufficient documentation

## 2013-10-07 DIAGNOSIS — Z86718 Personal history of other venous thrombosis and embolism: Secondary | ICD-10-CM | POA: Insufficient documentation

## 2013-10-07 LAB — D-DIMER, QUANTITATIVE: D-Dimer, Quant: <0.27 ug/mL-FEU (ref 0.00–0.48)

## 2013-10-07 MED ORDER — ALBUTEROL SULFATE HFA 108 (90 BASE) MCG/ACT IN AERS
2.0000 | INHALATION_SPRAY | RESPIRATORY_TRACT | Status: DC | PRN
Start: 2013-10-07 — End: 2013-10-08
  Administered 2013-10-07: 2 via RESPIRATORY_TRACT
  Filled 2013-10-07: qty 6.7

## 2013-10-07 MED ORDER — DOXYCYCLINE HYCLATE 100 MG PO CAPS: 100.0000 mg | ORAL_CAPSULE | Freq: Two times a day (BID) | ORAL | Status: DC

## 2013-10-07 NOTE — Discharge Instructions (Signed)

## 2013-10-07 NOTE — ED Provider Notes (Signed)
CSN: [id number]     Arrival date & time 10/07/13  4098 History  This chart was scribed for Glynn Octave, MD by Nicholos Johns, ED scribe. This patient was seen in room MH03/MH03 and the patient's care was started at 9:31 PM.     Chief Complaint  Patient presents with  . Cough   The history is provided by the patient. No language interpreter was used.  HPI Comments: Darolyn Double is a 23 y.o. female who presents to the Emergency Department complaining of gradually worsening green productive cough w/ chest & rib pain, sore throat, sneezing, rhinorrhea, and SOB, onset 1.5 weeks ago. Pt has been treating sx with Multi-symptom cold medicine. Pt denies any sick contacts. Pt is on depo provera. Pt has asthma and states medications provided do not work. Denies smoking. Denies fever.  PCP: Dr. Primus Bravo  Past Medical History  Diagnosis Date  . Autism   . Asthma   . Ulcerative colitis   . ADHD (attention deficit hyperactivity disorder)   . Esophagitis   . GERD (gastroesophageal reflux disease)   . Thyroid disease   . Depression   . Acute thrombosis of right cephalic vein    Past Surgical History  Procedure Laterality Date  . Knee arthroscopy     Family History  Problem Relation Age of Onset  . Hyperlipidemia Mother   . Thyroid disease Mother   . Diabetes Mother   . Diabetes Father    History  Substance Use Topics  . Smoking status: Never Smoker   . Smokeless tobacco: Never Used  . Alcohol Use: No   OB History   Grav Para Term Preterm Abortions TAB SAB Ect Mult Living                 Review of Systems  A complete 10 system review of systems was obtained and all systems are negative except as noted in the HPI and PMH.   Allergies  Bee venom and Other  Home Medications   Current Outpatient Rx  Name  Route  Sig  Dispense  Refill  . Alum & Mag Hydroxide-Simeth (GI COCKTAIL) SUSP suspension   Oral   Take 30 mLs by mouth as needed for indigestion. Shake well.          . Alum & Mag Hydroxide-Simeth (GI COCKTAIL) SUSP suspension   Oral   Take 30 mLs by mouth 3 (three) times daily as needed (for heartburn). Shake well.   300 mL   1   . amoxicillin (AMOXIL) 500 MG tablet   Oral   Take 1,000 mg by mouth 2 (two) times daily.         . baclofen (LIORESAL) 10 MG tablet   Oral   Take 10 mg by mouth 3 (three) times daily.         . cyclobenzaprine (FLEXERIL) 10 MG tablet   Oral   Take 1 tablet (10 mg total) by mouth 3 (three) times daily as needed for muscle spasms.   18 tablet   0   . HYDROcodone-acetaminophen (NORCO/VICODIN) 5-325 MG per tablet   Oral   Take 1 tablet by mouth every 6 (six) hours as needed for pain.         Marland Kitchen ibuprofen (ADVIL,MOTRIN) 800 MG tablet   Oral   Take 1 tablet (800 mg total) by mouth 3 (three) times daily.   21 tablet   0   . lansoprazole (PREVACID) 30 MG capsule   Oral  Take 30 mg by mouth 2 (two) times daily.         Marland Kitchen. levocetirizine (XYZAL) 5 MG tablet   Oral   Take 5 mg by mouth every evening.         Marland Kitchen. levothyroxine (SYNTHROID, LEVOTHROID) 88 MCG tablet   Oral   Take 88 mcg by mouth daily before breakfast.         . lidocaine (LIDODERM) 5 %   Transdermal   Place 1 patch onto the skin daily. Remove & Discard patch within 12 hours or as directed by MD   6 patch   0   . meloxicam (MOBIC) 7.5 MG tablet   Oral   Take 1 tablet (7.5 mg total) by mouth 2 (two) times daily as needed for pain.   30 tablet   0   . mesalamine (APRISO) 0.375 G 24 hr capsule   Oral   Take 375 mg by mouth 4 (four) times daily.         . metoCLOPramide (REGLAN) 10 MG tablet   Oral   Take 1 tablet (10 mg total) by mouth every 8 (eight) hours as needed (for nausea).   10 tablet   0   . montelukast (SINGULAIR) 10 MG tablet   Oral   Take 10 mg by mouth at bedtime.         . nitrofurantoin, macrocrystal-monohydrate, (MACROBID) 100 MG capsule   Oral   Take 1 capsule (100 mg total) by mouth 2 (two)  times daily. X 7 days   14 capsule   0   . ondansetron (ZOFRAN) 8 MG tablet   Oral   Take by mouth every 8 (eight) hours as needed for nausea.         . ondansetron (ZOFRAN-ODT) 4 MG disintegrating tablet   Oral   Take 4 mg by mouth every 8 (eight) hours as needed for nausea.         . predniSONE (DELTASONE) 10 MG tablet   Oral   Take 10 mg by mouth 3 (three) times daily.         . predniSONE (DELTASONE) 20 MG tablet   Oral   Take 1 tablet (20 mg total) by mouth 2 (two) times daily with a meal.   6 tablet   0   . promethazine (PHENERGAN) 25 MG tablet   Oral   Take 25 mg by mouth every 6 (six) hours as needed for nausea.         . sucralfate (CARAFATE) 1 G tablet   Oral   Take 1 g by mouth 4 (four) times daily.         . sucralfate (CARAFATE) 1 GM/10ML suspension   Oral   Take 10 mLs (1 g total) by mouth 4 (four) times daily.   420 mL   0   . sulfamethoxazole-trimethoprim (SEPTRA DS) 800-160 MG per tablet   Oral   Take 1 tablet by mouth every 12 (twelve) hours.   10 tablet   0   . sulfamethoxazole-trimethoprim (SEPTRA DS) 800-160 MG per tablet   Oral   Take 1 tablet by mouth every 12 (twelve) hours.   10 tablet   0   . topiramate (TOPAMAX) 25 MG tablet   Oral   Take 25 mg by mouth 2 (two) times daily. 3 at night         . traMADol (ULTRAM) 50 MG tablet   Oral   Take 1 tablet (50  mg total) by mouth every 6 (six) hours as needed for pain.   15 tablet   0   . ziprasidone (GEODON) 60 MG capsule   Oral   Take 60 mg by mouth daily. Changed to daily          Triage Vitals: BP 125/71  Pulse 106  Temp(Src) 97.9 F (36.6 C) (Oral)  Resp 20  Ht 5\' 7"  (1.702 m)  Wt 288 lb (130.636 kg)  BMI 45.10 kg/m2  SpO2 99%  Physical Exam  Nursing note and vitals reviewed. Constitutional: She is oriented to person, place, and time. She appears well-developed and well-nourished.  HENT:  Head: Normocephalic and atraumatic.  Right Ear: External ear  normal.  Left Ear: External ear normal.  Mouth/Throat: Posterior oropharyngeal erythema (mild) present.  Eyes: Conjunctivae and EOM are normal.  Neck: Normal range of motion.  No meningismus.  Cardiovascular: Normal rate, regular rhythm and normal heart sounds.  Exam reveals no gallop and no friction rub.   No murmur heard. Pulmonary/Chest: Effort normal and breath sounds normal. No respiratory distress. She has no wheezes. She has no rales.  Chest wall tender to palpation  Musculoskeletal: Normal range of motion. She exhibits no tenderness.  Neurological: She is oriented to person, place, and time. She has normal reflexes.  Skin: Skin is warm and dry.  Psychiatric: She has a normal mood and affect. Her behavior is normal.   ED Course  Procedures  DIAGNOSTIC STUDIES: Oxygen Saturation is 99% on room air, normal by my interpretation.    COORDINATION OF CARE: At 9:35 PM: Discussed treatment plan with patient which includes an inhaler to help with breathing and blood work. Patient agrees.   Labs Review Labs Reviewed - No data to display Imaging Review Dg Chest 2 View  10/07/2013   CLINICAL DATA:  23 year old female with cough. Initial encounter.  EXAM: CHEST  2 VIEW  COMPARISON:  Chest CTA 05/25/2013 and earlier.  FINDINGS: The heart size and mediastinal contours are within normal limits. Minimal perihilar peribronchovascular opacity, otherwise both lungs are clear. No pneumothorax or effusion. The visualized skeletal structures are unremarkable. Visualized tracheal air column is within normal limits.  IMPRESSION: Minimal increased perihilar opacity compared to 04/26/2013, such as due to viral airway disease. Otherwise negative chest.   Electronically Signed   By: Augusto Gamble M.D.   On: 10/07/2013 19:57    EKG Interpretation    Date/Time:  Thursday October 07 2013 22:08:03 EST Ventricular Rate:  62 PR Interval:  144 QRS Duration: 94 QT Interval:  414 QTC Calculation: 420 R  Axis:   40 Text Interpretation:  Sinus rhythm with marked sinus arrhythmia Otherwise normal ECG No previous ECGs available Confirmed by Manus Gunning  MD, Meganne Rita ([id number]) on 10/07/2013 10:13:41 PM            MDM   Final diagnoses:  None   ten-day history of cough productive of clear yellow mucus without fever. She has some chest tightness with coughing. No nausea vomiting. No sick contacts. History of asthma.  Patient is in no distress. Lungs are clear. No wheezing. Chest x-ray shows perihilar opacities increased from previous. D-dimer negative.  Given length of illness, we'll treat for bronchitis. No steroids as she is not wheezing at this time.   I personally performed the services described in this documentation, which was scribed in my presence. The recorded information has been reviewed and is accurate.     Glynn Octave, MD 10/08/13 0001

## 2013-10-07 NOTE — ED Notes (Signed)
Cough without fever for almost 2 weeks. Sneezing.

## 2013-11-02 ENCOUNTER — Emergency Department (HOSPITAL_BASED_OUTPATIENT_CLINIC_OR_DEPARTMENT_OTHER)
Admission: EM | Admit: 2013-11-02 | Discharge: 2013-11-02 | Disposition: A | Discharge status: Home / Self Care | Payer: PRIVATE HEALTH INSURANCE | Attending: Emergency Medicine | Admitting: Emergency Medicine

## 2013-11-02 ENCOUNTER — Encounter (HOSPITAL_BASED_OUTPATIENT_CLINIC_OR_DEPARTMENT_OTHER): Payer: Self-pay | Admitting: Emergency Medicine

## 2013-11-02 DIAGNOSIS — M21619 Bunion of unspecified foot: Secondary | ICD-10-CM | POA: Insufficient documentation

## 2013-11-02 DIAGNOSIS — IMO0002 Reserved for concepts with insufficient information to code with codable children: Secondary | ICD-10-CM | POA: Insufficient documentation

## 2013-11-02 DIAGNOSIS — M79673 Pain in unspecified foot: Secondary | ICD-10-CM

## 2013-11-02 DIAGNOSIS — F840 Autistic disorder: Secondary | ICD-10-CM | POA: Insufficient documentation

## 2013-11-02 DIAGNOSIS — J45909 Unspecified asthma, uncomplicated: Secondary | ICD-10-CM | POA: Insufficient documentation

## 2013-11-02 DIAGNOSIS — Z86718 Personal history of other venous thrombosis and embolism: Secondary | ICD-10-CM | POA: Insufficient documentation

## 2013-11-02 DIAGNOSIS — K219 Gastro-esophageal reflux disease without esophagitis: Secondary | ICD-10-CM | POA: Insufficient documentation

## 2013-11-02 DIAGNOSIS — Z79899 Other long term (current) drug therapy: Secondary | ICD-10-CM | POA: Insufficient documentation

## 2013-11-02 DIAGNOSIS — F3289 Other specified depressive episodes: Secondary | ICD-10-CM | POA: Insufficient documentation

## 2013-11-02 DIAGNOSIS — M205X9 Other deformities of toe(s) (acquired), unspecified foot: Secondary | ICD-10-CM | POA: Insufficient documentation

## 2013-11-02 DIAGNOSIS — Z791 Long term (current) use of non-steroidal anti-inflammatories (NSAID): Secondary | ICD-10-CM | POA: Insufficient documentation

## 2013-11-02 DIAGNOSIS — G8929 Other chronic pain: Secondary | ICD-10-CM | POA: Insufficient documentation

## 2013-11-02 DIAGNOSIS — F329 Major depressive disorder, single episode, unspecified: Secondary | ICD-10-CM | POA: Insufficient documentation

## 2013-11-02 DIAGNOSIS — E079 Disorder of thyroid, unspecified: Secondary | ICD-10-CM | POA: Insufficient documentation

## 2013-11-02 DIAGNOSIS — M25579 Pain in unspecified ankle and joints of unspecified foot: Secondary | ICD-10-CM | POA: Insufficient documentation

## 2013-11-02 DIAGNOSIS — Z792 Long term (current) use of antibiotics: Secondary | ICD-10-CM | POA: Insufficient documentation

## 2013-11-02 MED ORDER — MELOXICAM 7.5 MG PO TABS: 7.5000 mg | ORAL_TABLET | Freq: Every day | ORAL | Status: DC

## 2013-11-02 NOTE — ED Notes (Signed)
Mother reports patient had knee surgery by MD Pill with [facility] and that patient was sent to foot doctor who wants patient to have inserts made for her shoes but insurance will not cover it and patient has been in pain for 1 month and having a hard time walking and bearing weight.

## 2013-11-02 NOTE — ED Provider Notes (Signed)
CSN: [id number]     Arrival date & time 11/02/13  0028 History   First MD Initiated Contact with Patient 11/02/13 0043     Chief Complaint  Patient presents with  . Foot Pain     (Consider location/radiation/quality/duration/timing/severity/associated sxs/prior Treatment) Patient is a 23 y.o. female presenting with lower extremity pain. The history is provided by the patient and a parent. No language interpreter was used.  Foot Pain This is a chronic problem. The current episode started more than 1 week ago. The problem occurs constantly. The problem has not changed since onset.Pertinent negatives include no chest pain, no abdominal pain, no headaches and no shortness of breath. Nothing aggravates the symptoms. Nothing relieves the symptoms. She has tried nothing for the symptoms. The treatment provided no relief.  Has left foot bunion and great toe deviation.  Was told she needed orthotics but could not afford them and doesn't know what to do  Past Medical History  Diagnosis Date  . Autism   . Asthma   . Ulcerative colitis   . ADHD (attention deficit hyperactivity disorder)   . Esophagitis   . GERD (gastroesophageal reflux disease)   . Thyroid disease   . Depression   . Acute thrombosis of right cephalic vein    Past Surgical History  Procedure Laterality Date  . Knee arthroscopy     Family History  Problem Relation Age of Onset  . Hyperlipidemia Mother   . Thyroid disease Mother   . Diabetes Mother   . Diabetes Father    History  Substance Use Topics  . Smoking status: Never Smoker   . Smokeless tobacco: Never Used  . Alcohol Use: No   OB History   Grav Para Term Preterm Abortions TAB SAB Ect Mult Living                 Review of Systems  Respiratory: Negative for shortness of breath.   Cardiovascular: Negative for chest pain.  Gastrointestinal: Negative for abdominal pain.  Neurological: Negative for headaches.  All other systems reviewed and are  negative.      Allergies  Bee venom and Other  Home Medications   Current Outpatient Rx  Name  Route  Sig  Dispense  Refill  . Alum & Mag Hydroxide-Simeth (GI COCKTAIL) SUSP suspension   Oral   Take 30 mLs by mouth as needed for indigestion. Shake well.         . Alum & Mag Hydroxide-Simeth (GI COCKTAIL) SUSP suspension   Oral   Take 30 mLs by mouth 3 (three) times daily as needed (for heartburn). Shake well.   300 mL   1   . amoxicillin (AMOXIL) 500 MG tablet   Oral   Take 1,000 mg by mouth 2 (two) times daily.         . baclofen (LIORESAL) 10 MG tablet   Oral   Take 10 mg by mouth 3 (three) times daily.         . cyclobenzaprine (FLEXERIL) 10 MG tablet   Oral   Take 1 tablet (10 mg total) by mouth 3 (three) times daily as needed for muscle spasms.   18 tablet   0   . doxycycline (VIBRAMYCIN) 100 MG capsule   Oral   Take 1 capsule (100 mg total) by mouth 2 (two) times daily.   20 capsule   0   . HYDROcodone-acetaminophen (NORCO/VICODIN) 5-325 MG per tablet   Oral   Take 1 tablet by  mouth every 6 (six) hours as needed for pain.         Marland Kitchen. ibuprofen (ADVIL,MOTRIN) 800 MG tablet   Oral   Take 1 tablet (800 mg total) by mouth 3 (three) times daily.   21 tablet   0   . lansoprazole (PREVACID) 30 MG capsule   Oral   Take 30 mg by mouth 2 (two) times daily.         Marland Kitchen. levocetirizine (XYZAL) 5 MG tablet   Oral   Take 5 mg by mouth every evening.         Marland Kitchen. levothyroxine (SYNTHROID, LEVOTHROID) 88 MCG tablet   Oral   Take 88 mcg by mouth daily before breakfast.         . lidocaine (LIDODERM) 5 %   Transdermal   Place 1 patch onto the skin daily. Remove & Discard patch within 12 hours or as directed by MD   6 patch   0   . meloxicam (MOBIC) 7.5 MG tablet   Oral   Take 1 tablet (7.5 mg total) by mouth 2 (two) times daily as needed for pain.   30 tablet   0   . meloxicam (MOBIC) 7.5 MG tablet   Oral   Take 1 tablet (7.5 mg total) by mouth  daily.   7 tablet   0   . mesalamine (APRISO) 0.375 G 24 hr capsule   Oral   Take 375 mg by mouth 4 (four) times daily.         . metoCLOPramide (REGLAN) 10 MG tablet   Oral   Take 1 tablet (10 mg total) by mouth every 8 (eight) hours as needed (for nausea).   10 tablet   0   . montelukast (SINGULAIR) 10 MG tablet   Oral   Take 10 mg by mouth at bedtime.         . nitrofurantoin, macrocrystal-monohydrate, (MACROBID) 100 MG capsule   Oral   Take 1 capsule (100 mg total) by mouth 2 (two) times daily. X 7 days   14 capsule   0   . ondansetron (ZOFRAN) 8 MG tablet   Oral   Take by mouth every 8 (eight) hours as needed for nausea.         . ondansetron (ZOFRAN-ODT) 4 MG disintegrating tablet   Oral   Take 4 mg by mouth every 8 (eight) hours as needed for nausea.         . predniSONE (DELTASONE) 10 MG tablet   Oral   Take 10 mg by mouth 3 (three) times daily.         . predniSONE (DELTASONE) 20 MG tablet   Oral   Take 1 tablet (20 mg total) by mouth 2 (two) times daily with a meal.   6 tablet   0   . promethazine (PHENERGAN) 25 MG tablet   Oral   Take 25 mg by mouth every 6 (six) hours as needed for nausea.         . sucralfate (CARAFATE) 1 G tablet   Oral   Take 1 g by mouth 4 (four) times daily.         . sucralfate (CARAFATE) 1 GM/10ML suspension   Oral   Take 10 mLs (1 g total) by mouth 4 (four) times daily.   420 mL   0   . sulfamethoxazole-trimethoprim (SEPTRA DS) 800-160 MG per tablet   Oral   Take 1 tablet by mouth every  12 (twelve) hours.   10 tablet   0   . sulfamethoxazole-trimethoprim (SEPTRA DS) 800-160 MG per tablet   Oral   Take 1 tablet by mouth every 12 (twelve) hours.   10 tablet   0   . topiramate (TOPAMAX) 25 MG tablet   Oral   Take 25 mg by mouth 2 (two) times daily. 3 at night         . traMADol (ULTRAM) 50 MG tablet   Oral   Take 1 tablet (50 mg total) by mouth every 6 (six) hours as needed for pain.   15  tablet   0   . ziprasidone (GEODON) 60 MG capsule   Oral   Take 60 mg by mouth daily. Changed to daily          BP 127/71  Pulse 88  Temp(Src) 98.1 F (36.7 C) (Oral)  Resp 20  Ht 5\' 7"  (1.702 m)  Wt 288 lb (130.636 kg)  BMI 45.10 kg/m2  SpO2 100% Physical Exam  Constitutional: She is oriented to person, place, and time. She appears well-developed and well-nourished. No distress.  HENT:  Head: Normocephalic and atraumatic.  Mouth/Throat: Oropharynx is clear and moist.  Eyes: Conjunctivae are normal. Pupils are equal, round, and reactive to light.  Neck: Normal range of motion. Neck supple.  Cardiovascular: Normal rate, regular rhythm and intact distal pulses.   Pulmonary/Chest: Effort normal and breath sounds normal. She has no wheezes. She has no rales.  Abdominal: Soft. Bowel sounds are normal. There is no tenderness.  Musculoskeletal: Normal range of motion.  Left foot bunion with great toe deviation towards second toe and flat feet.  Neurovascularly intact foot  Neurological: She is alert and oriented to person, place, and time.  Skin: Skin is warm and dry.  Psychiatric: She has a normal mood and affect.    ED Course  Procedures (including critical care time) Labs Review Labs Reviewed - No data to display Imaging Review No results found.   EKG Interpretation None      MDM   Final diagnoses:  Foot pain    Poor shoe choice recommend closed toe show with high arch and Dr. Jari Sportsman orthotic if custom orthotics are too expensive.  Ice and elevate feet when not walking.     Jasmine Awe, MD 11/02/13 [phone]

## 2013-11-13 ENCOUNTER — Emergency Department (INDEPENDENT_AMBULATORY_CARE_PROVIDER_SITE_OTHER): Payer: Medicare Other

## 2013-11-13 ENCOUNTER — Emergency Department (INDEPENDENT_AMBULATORY_CARE_PROVIDER_SITE_OTHER)
Admission: EM | Admit: 2013-11-13 | Discharge: 2013-11-13 | Disposition: A | Discharge status: Home / Self Care | Payer: Medicare Other | Source: Home / Self Care | Attending: Family Medicine | Admitting: Family Medicine

## 2013-11-13 ENCOUNTER — Encounter: Payer: Self-pay | Admitting: Emergency Medicine

## 2013-11-13 DIAGNOSIS — M542 Cervicalgia: Secondary | ICD-10-CM

## 2013-11-13 DIAGNOSIS — R209 Unspecified disturbances of skin sensation: Secondary | ICD-10-CM

## 2013-11-13 DIAGNOSIS — R202 Paresthesia of skin: Secondary | ICD-10-CM

## 2013-11-13 MED ORDER — PREDNISONE 20 MG PO TABS: 20.0000 mg | ORAL_TABLET | Freq: Two times a day (BID) | ORAL | Status: DC

## 2013-11-13 MED ORDER — TRAMADOL HCL 50 MG PO TABS: 50.0000 mg | ORAL_TABLET | Freq: Four times a day (QID) | ORAL | Status: DC | PRN

## 2013-11-13 NOTE — ED Notes (Signed)
Patient states started having bilateral hand pain and numbness last night; went to purchase wrist braces thinking might be carpal tunnel, but not able to find correct size/kind. Does play lots of computer games. Smiling and joking. Mother accompanying.

## 2013-11-13 NOTE — ED Provider Notes (Addendum)
CSN: [id number]     Arrival date & time 11/13/13  1708 History   None    Chief Complaint  Patient presents with  . Hand Pain      HPI Comments: Patient complains of onset of numbness and pain in both hands last night.  The numbness radiates into both upper arms to shoulders.  Mother notes that she has had a bilateral decrease in hand strength over the past several days.  Patient plays with her computer game (Wii) daily, but no recent change in activities or injury. Patient has a history of hypothyroid, but has not take her levothyroid for about a month.  Patient is a 23 y.o. female presenting with wrist pain. The history is provided by the patient and a parent.  Wrist Pain This is a new problem. Episode onset: Last night. The problem occurs constantly. The problem has been gradually worsening. Pertinent negatives include no headaches. Associated symptoms comments: Bilateral arm pain. Exacerbated by: grasping with hands. Nothing relieves the symptoms. She has tried nothing for the symptoms.    Past Medical History  Diagnosis Date  . Autism   . Asthma   . Ulcerative colitis   . ADHD (attention deficit hyperactivity disorder)   . Esophagitis   . GERD (gastroesophageal reflux disease)   . Thyroid disease   . Depression   . Acute thrombosis of right cephalic vein    Past Surgical History  Procedure Laterality Date  . Knee arthroscopy     Family History  Problem Relation Age of Onset  . Hyperlipidemia Mother   . Thyroid disease Mother   . Diabetes Mother   . Diabetes Father    History  Substance Use Topics  . Smoking status: Never Smoker   . Smokeless tobacco: Never Used  . Alcohol Use: No   OB History   Grav Para Term Preterm Abortions TAB SAB Ect Mult Living                 Review of Systems  Constitutional: Negative for fever and fatigue.  HENT: Negative.   Eyes: Negative.   Respiratory: Negative.   Cardiovascular: Negative.   Gastrointestinal: Negative.    Genitourinary: Negative.   Neurological: Positive for numbness. Negative for speech difficulty, weakness and headaches.    Allergies  Bee venom and Other  Home Medications   Current Outpatient Rx  Name  Route  Sig  Dispense  Refill  . Alum & Mag Hydroxide-Simeth (GI COCKTAIL) SUSP suspension   Oral   Take 30 mLs by mouth as needed for indigestion. Shake well.         . Alum & Mag Hydroxide-Simeth (GI COCKTAIL) SUSP suspension   Oral   Take 30 mLs by mouth 3 (three) times daily as needed (for heartburn). Shake well.   300 mL   1   . amoxicillin (AMOXIL) 500 MG tablet   Oral   Take 1,000 mg by mouth 2 (two) times daily.         . baclofen (LIORESAL) 10 MG tablet   Oral   Take 10 mg by mouth 3 (three) times daily.         . cyclobenzaprine (FLEXERIL) 10 MG tablet   Oral   Take 1 tablet (10 mg total) by mouth 3 (three) times daily as needed for muscle spasms.   18 tablet   0   . doxycycline (VIBRAMYCIN) 100 MG capsule   Oral   Take 1 capsule (100 mg total) by  mouth 2 (two) times daily.   20 capsule   0   . HYDROcodone-acetaminophen (NORCO/VICODIN) 5-325 MG per tablet   Oral   Take 1 tablet by mouth every 6 (six) hours as needed for pain.         Marland Kitchen. ibuprofen (ADVIL,MOTRIN) 800 MG tablet   Oral   Take 1 tablet (800 mg total) by mouth 3 (three) times daily.   21 tablet   0   . lansoprazole (PREVACID) 30 MG capsule   Oral   Take 30 mg by mouth 2 (two) times daily.         Marland Kitchen. levocetirizine (XYZAL) 5 MG tablet   Oral   Take 5 mg by mouth every evening.         Marland Kitchen. levothyroxine (SYNTHROID, LEVOTHROID) 88 MCG tablet   Oral   Take 88 mcg by mouth daily before breakfast.         . lidocaine (LIDODERM) 5 %   Transdermal   Place 1 patch onto the skin daily. Remove & Discard patch within 12 hours or as directed by MD   6 patch   0   . meloxicam (MOBIC) 7.5 MG tablet   Oral   Take 1 tablet (7.5 mg total) by mouth 2 (two) times daily as needed for  pain.   30 tablet   0   . meloxicam (MOBIC) 7.5 MG tablet   Oral   Take 1 tablet (7.5 mg total) by mouth daily.   7 tablet   0   . mesalamine (APRISO) 0.375 G 24 hr capsule   Oral   Take 375 mg by mouth 4 (four) times daily.         . metoCLOPramide (REGLAN) 10 MG tablet   Oral   Take 1 tablet (10 mg total) by mouth every 8 (eight) hours as needed (for nausea).   10 tablet   0   . montelukast (SINGULAIR) 10 MG tablet   Oral   Take 10 mg by mouth at bedtime.         . nitrofurantoin, macrocrystal-monohydrate, (MACROBID) 100 MG capsule   Oral   Take 1 capsule (100 mg total) by mouth 2 (two) times daily. X 7 days   14 capsule   0   . ondansetron (ZOFRAN) 8 MG tablet   Oral   Take by mouth every 8 (eight) hours as needed for nausea.         . ondansetron (ZOFRAN-ODT) 4 MG disintegrating tablet   Oral   Take 4 mg by mouth every 8 (eight) hours as needed for nausea.         . predniSONE (DELTASONE) 20 MG tablet   Oral   Take 1 tablet (20 mg total) by mouth 2 (two) times daily with a meal.   10 tablet   0   . promethazine (PHENERGAN) 25 MG tablet   Oral   Take 25 mg by mouth every 6 (six) hours as needed for nausea.         . sucralfate (CARAFATE) 1 G tablet   Oral   Take 1 g by mouth 4 (four) times daily.         . sucralfate (CARAFATE) 1 GM/10ML suspension   Oral   Take 10 mLs (1 g total) by mouth 4 (four) times daily.   420 mL   0   . sulfamethoxazole-trimethoprim (SEPTRA DS) 800-160 MG per tablet   Oral   Take 1 tablet by  mouth every 12 (twelve) hours.   10 tablet   0   . sulfamethoxazole-trimethoprim (SEPTRA DS) 800-160 MG per tablet   Oral   Take 1 tablet by mouth every 12 (twelve) hours.   10 tablet   0   . topiramate (TOPAMAX) 25 MG tablet   Oral   Take 25 mg by mouth 2 (two) times daily. 3 at night         . traMADol (ULTRAM) 50 MG tablet   Oral   Take 1 tablet (50 mg total) by mouth every 6 (six) hours as needed.   12  tablet   0   . ziprasidone (GEODON) 60 MG capsule   Oral   Take 60 mg by mouth daily. Changed to daily          BP 107/76  Pulse 97  Temp(Src) 98.5 F (36.9 C) (Oral)  Resp 18  Ht 5\' 7"  (1.702 m)  Wt 289 lb (131.09 kg)  BMI 45.25 kg/m2  SpO2 96% Physical Exam Nursing notes and Vital Signs reviewed. Appearance:  Patient appears stated age, and in no acute distress.  Patient is morbidly obese (BMI 45.3) Eyes:  Pupils are equal, round, and reactive to light and accomodation.  Extraocular movement is intact.  Conjunctivae are not inflamed  Nose:  Normal turbinates Pharynx:  Normal Neck:  Supple.  No adenopathy or thyromegaly.  Vague posterior tenderness to palpation. Lungs:  Clear to auscultation.  Breath sounds are equal.  Heart:  Regular rate and rhythm without murmurs, rubs, or gallops.  Abdomen:  Nontender without masses or hepatosplenomegaly.  Bowel sounds are present.  No CVA or flank tenderness.  Extremities:  No edema.  No calf tenderness.  Both wrists are tender to palpation Skin:  No rash present.  Neurologic:  Cranial nerves 2 through 12 are normal.   Gait and station are normal.  Grip strength symmetric bilaterally but decreased.  Sensation in hands/fingers difficult to ascertain, but probably decreased.  Tinel's and Phalen's test positive both wrists.     ED Course  Procedures  none    Labs Reviewed  TSH  T3, FREE  VITAMIN B12   Imaging Review Dg Cervical Spine Complete  11/13/2013   CLINICAL DATA:  23 year old female with neck pain and bilateral arm and hand numbness.  EXAM: CERVICAL SPINE  4+ VIEWS  COMPARISON:  None.  FINDINGS: Normal alignment is noted.  There is no evidence of fracture, subluxation or prevertebral soft tissue swelling.  The disc spaces are maintained and there is no evidence of bony foraminal narrowing.  No focal bony lesions are present.  IMPRESSION: Negative cervical spine radiographs.   Electronically Signed   By: Laveda Abbe M.D.   On:  11/13/2013 18:21     MDM   1. Paresthesias in right hand; suspect bilateral carpal tunnel syndrome   2. Paresthesias in left hand    Check TSH, free T3, and vitamin B12 level.  Rx given for Ultram.  Prednisone burst.  Obtain wrist splints for both wrists.  Wear wrist splints at night.  Resume thyroid medication Followup with neurologist.    Lattie Haw, MD 11/15/13 0825  Addendum: Normal TSH and free T3 Low Vitamin B12:  200pg/mL  Lattie Haw, MD 11/15/13 [phone]

## 2013-11-13 NOTE — Discharge Instructions (Signed)
Obtain wrist splints for both wrists.  Resume thyroid medication.   Carpal Tunnel Syndrome The carpal tunnel is a narrow area located on the palm side of your wrist. The tunnel is formed by the wrist bones and ligaments. Nerves, blood vessels, and tendons pass through the carpal tunnel. Repeated wrist motion or certain diseases may cause swelling within the tunnel. This swelling pinches the main nerve in the wrist (median nerve) and causes the painful hand and arm condition called carpal tunnel syndrome. CAUSES   Repeated wrist motions.  Wrist injuries.  Certain diseases like arthritis, diabetes, alcoholism, hyperthyroidism, and kidney failure.  Obesity.  Pregnancy. SYMPTOMS   A "pins and needles" feeling in your fingers or hand.  Tingling or numbness in your fingers or hand.  An aching feeling in your entire arm.  Wrist pain that goes up your arm to your shoulder.  Pain that goes down into your palm or fingers.  A weak feeling in your hands. DIAGNOSIS  Your caregiver will take your history and perform a physical exam. An electromyography test may be needed. This test measures electrical signals sent out by the muscles. The electrical signals are usually slowed by carpal tunnel syndrome. You may also need X-rays. TREATMENT  Carpal tunnel syndrome may clear up by itself. Your caregiver may recommend a wrist splint or medicine such as a nonsteroidal anti-inflammatory medicine. Cortisone injections may help. Sometimes, surgery may be needed to free the pinched nerve.  HOME CARE INSTRUCTIONS   Take all medicine as directed by your caregiver. Only take over-the-counter or prescription medicines for pain, discomfort, or fever as directed by your caregiver.  If you were given a splint to keep your wrist from bending, wear it as directed. It is important to wear the splint at night. Wear the splint for as long as you have pain or numbness in your hand, arm, or wrist. This may take 1 to 2  months.  Rest your wrist from any activity that may be causing your pain. If your symptoms are work-related, you may need to talk to your employer about changing to a job that does not require using your wrist.  Put ice on your wrist after long periods of wrist activity.  Put ice in a plastic bag.  Place a towel between your skin and the bag.  Leave the ice on for 15-20 minutes, 03-04 times a day.  Keep all follow-up visits as directed by your caregiver. This includes any orthopedic referrals, physical therapy, and rehabilitation. Any delay in getting necessary care could result in a delay or failure of your condition to heal. SEEK IMMEDIATE MEDICAL CARE IF:   You have new, unexplained symptoms.  Your symptoms get worse and are not helped or controlled with medicines. MAKE SURE YOU:   Understand these instructions.  Will watch your condition.  Will get help right away if you are not doing well or get worse. Document Released: 08/09/2000 Document Revised: 11/04/2011 Document Reviewed: 06/28/2011 [facility] Patient Information 2014 [place], [place].

## 2013-11-14 LAB — TSH: TSH: 3.609 u[IU]/mL (ref 0.350–4.500)

## 2013-11-14 LAB — T3, FREE: T3, Free: 3.5 pg/mL (ref 2.3–4.2)

## 2013-11-14 LAB — VITAMIN B12: Vitamin B-12: 200 pg/mL — ABNORMAL LOW (ref 211–911)

## 2013-11-15 NOTE — ED Notes (Signed)
Per Dr. Rolla PlateBeese's request, [facility]. [facility] notes, labs and [facility]. They will contact patient.

## 2013-12-26 DIAGNOSIS — G5603 Carpal tunnel syndrome, bilateral upper limbs: Secondary | ICD-10-CM | POA: Insufficient documentation

## 2014-02-23 IMAGING — CT CT ANGIO CHEST
2 of 6 series · 19 of 36 positions shown · IV contrast (APPLIED)
Comparison: None.

CLINICAL DATA: Right arm pain, chest pain.

EXAM:
CT ANGIOGRAPHY CHEST WITH CONTRAST
TECHNIQUE: Multidetector CT imaging of the chest was performed using the
standard protocol during bolus administration of intravenous
contrast. Multiplanar CT image reconstructions including MIPs were
obtained to evaluate the vascular anatomy.
CONTRAST:  80mL OMNIPAQUE IOHEXOL 350 MG/ML SOLN

[Series 7: pe 1.0 b26f · axial · 0.71mm/px · z∈[+1190,+1384]mm · 18 of 216 slices shown]
[im 11/216  lung]
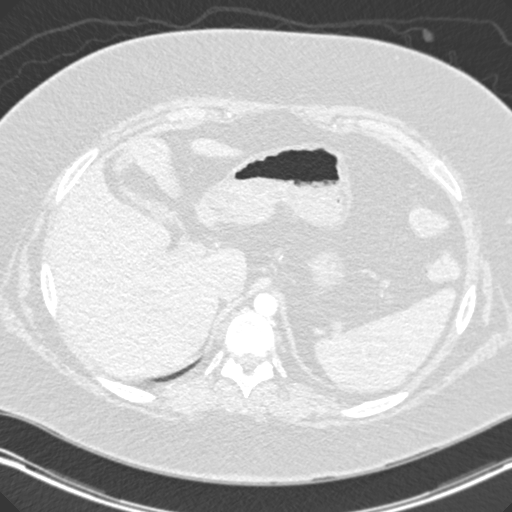
[im 22/216  mediastinal]
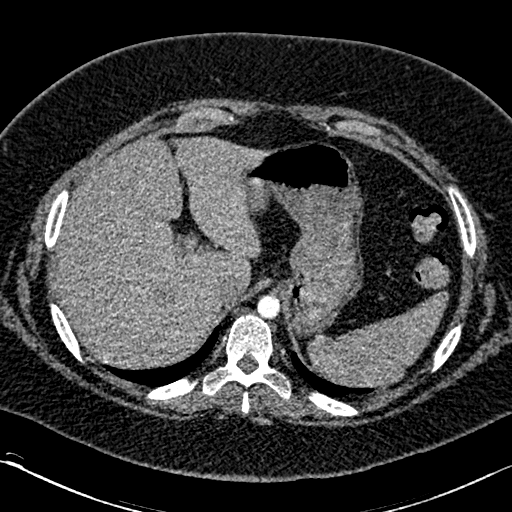
[im 33/216  lung]
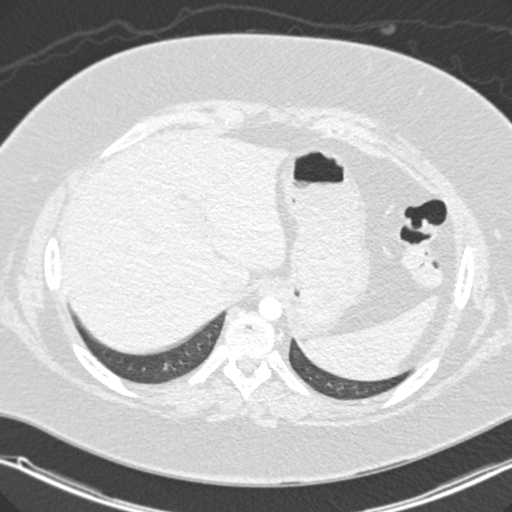
[im 44/216  mediastinal]
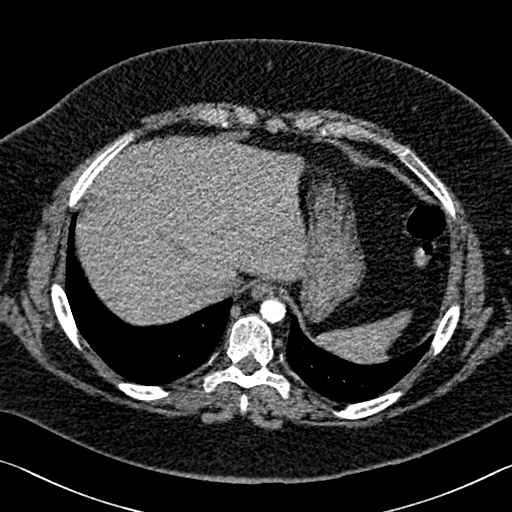
[im 54/216  lung]
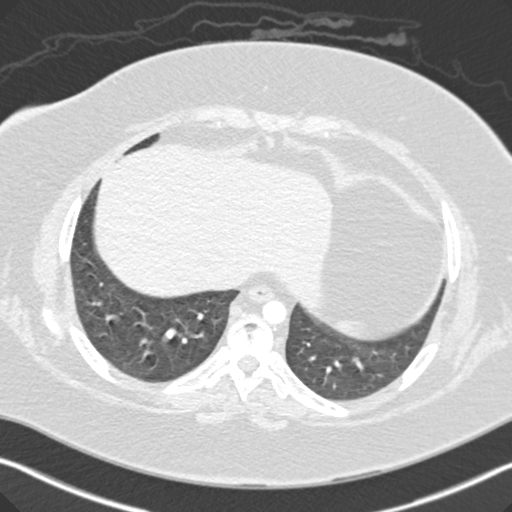
[im 65/216  mediastinal]
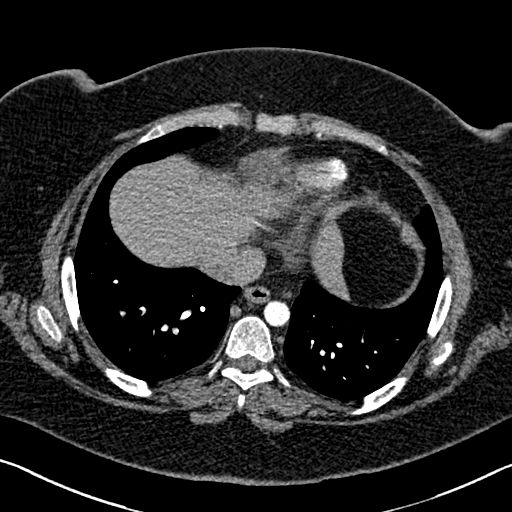
[im 76/216  lung]
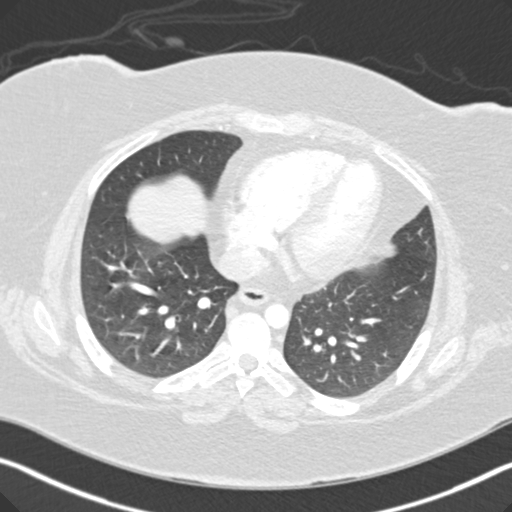
[im 87/216  mediastinal]
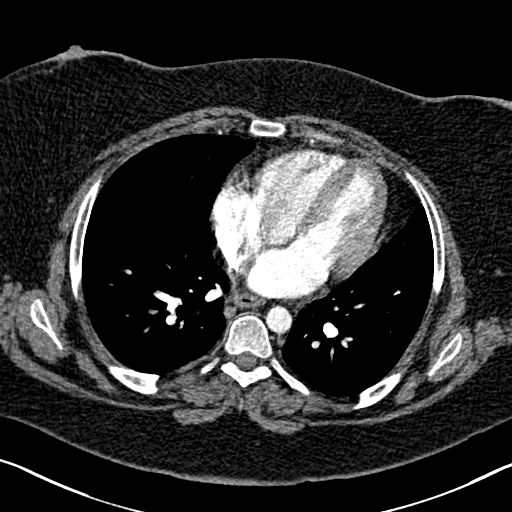
[im 97/216  lung]
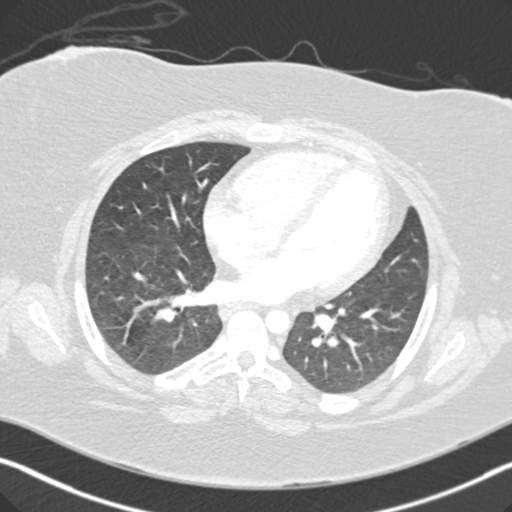
[im 119/216  mediastinal]
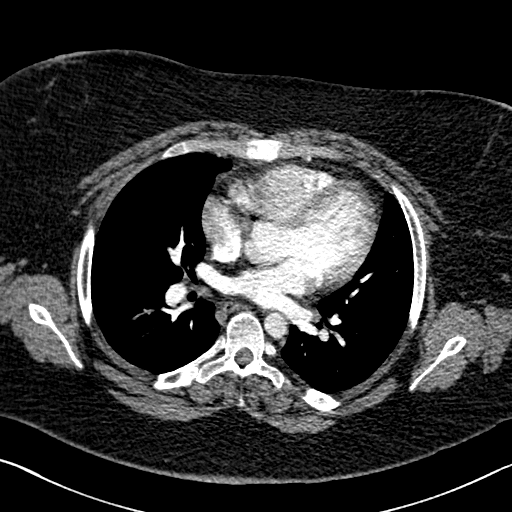
[im 130/216  lung]
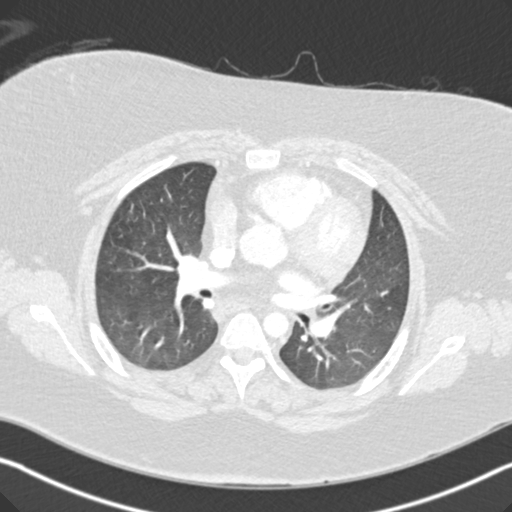
[im 140/216  mediastinal]
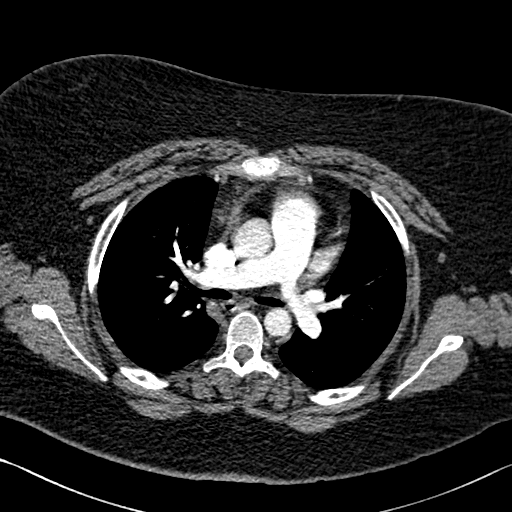
[im 151/216  lung]
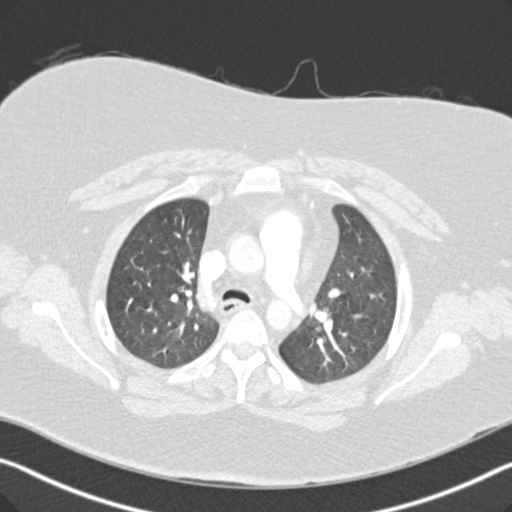
[im 162/216  mediastinal]
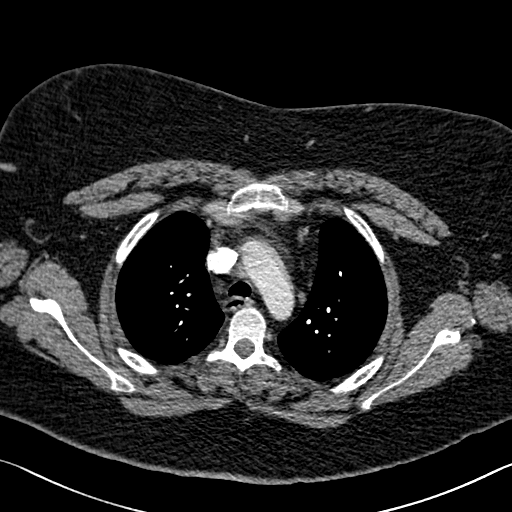
[im 173/216  lung]
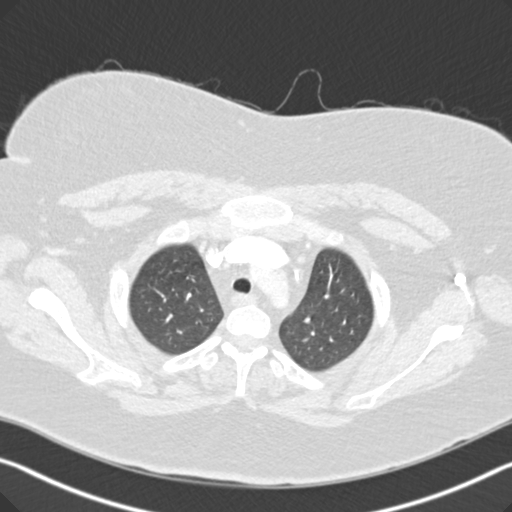
[im 183/216  mediastinal]
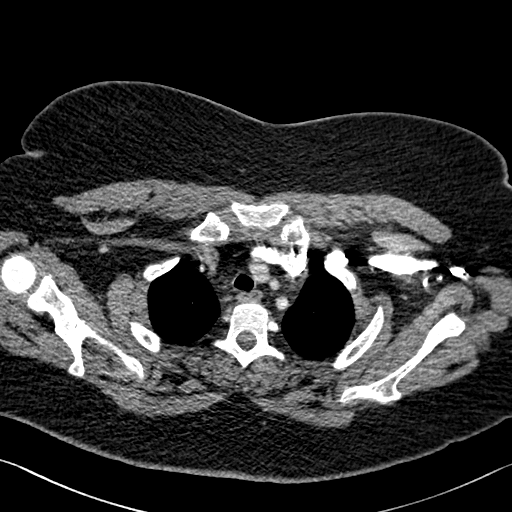
[im 194/216  lung]
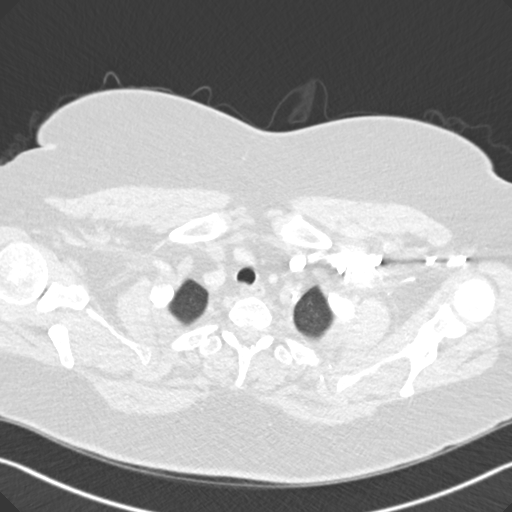
[im 205/216  mediastinal]
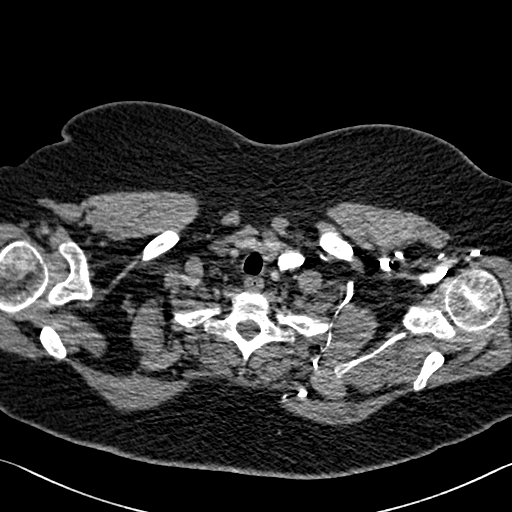

[Series 10: pe 2.0 coronal · coronal · 0.46mm/px · 1 of 135 slices shown]
[im 68/135  mediastinal]
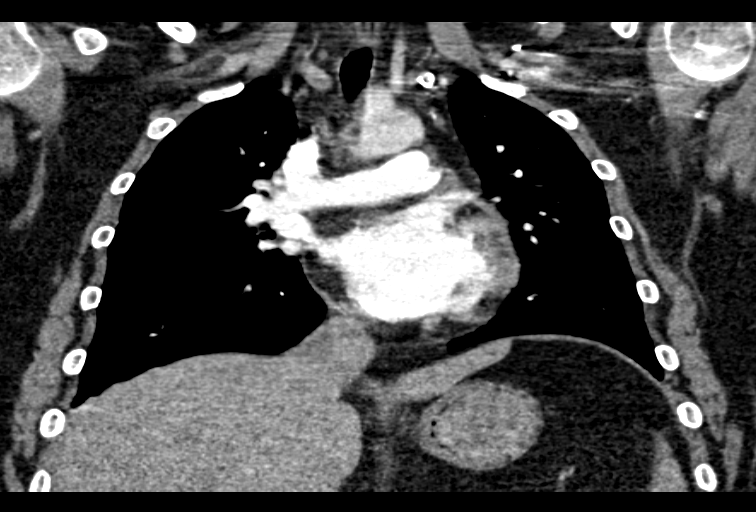

[19 of 36 positions shown; findings below may reference images not displayed]

FINDINGS: No filling defects in the pulmonary arteries to suggest pulmonary
emboli. Heart is normal size. Aorta is normal caliber. No
mediastinal, hilar, or axillary adenopathy. Visualized thyroid and
chest wall soft tissues unremarkable. Lungs are clear. No focal
airspace opacitiesor suspicious nodules. No effusions. Imaging into
the upper abdomen shows no acute findings. No acute bony
abnormality.

Review of the MIP images confirms the above findings.
IMPRESSION: No acute findings.

## 2015-09-13 DIAGNOSIS — M2241 Chondromalacia patellae, right knee: Secondary | ICD-10-CM | POA: Insufficient documentation

## 2015-10-10 DIAGNOSIS — Z7189 Other specified counseling: Secondary | ICD-10-CM | POA: Insufficient documentation

## 2015-11-26 ENCOUNTER — Emergency Department
Admission: EM | Admit: 2015-11-26 | Discharge: 2015-11-26 | Discharge status: Absent without leave | Payer: Medicaid Other | Source: Home / Self Care

## 2016-05-06 DIAGNOSIS — E221 Hyperprolactinemia: Secondary | ICD-10-CM | POA: Insufficient documentation

## 2016-08-07 DIAGNOSIS — M47816 Spondylosis without myelopathy or radiculopathy, lumbar region: Secondary | ICD-10-CM | POA: Insufficient documentation

## 2016-08-20 DIAGNOSIS — K760 Fatty (change of) liver, not elsewhere classified: Secondary | ICD-10-CM | POA: Insufficient documentation

## 2016-09-16 DIAGNOSIS — F325 Major depressive disorder, single episode, in full remission: Secondary | ICD-10-CM | POA: Insufficient documentation

## 2017-01-06 DIAGNOSIS — S83241A Other tear of medial meniscus, current injury, right knee, initial encounter: Secondary | ICD-10-CM | POA: Insufficient documentation

## 2017-01-27 DIAGNOSIS — T7421XA Adult sexual abuse, confirmed, initial encounter: Secondary | ICD-10-CM | POA: Insufficient documentation

## 2017-02-03 DIAGNOSIS — F3341 Major depressive disorder, recurrent, in partial remission: Secondary | ICD-10-CM | POA: Insufficient documentation

## 2017-02-28 DIAGNOSIS — Z9889 Other specified postprocedural states: Secondary | ICD-10-CM | POA: Insufficient documentation

## 2017-03-06 DIAGNOSIS — G5702 Lesion of sciatic nerve, left lower limb: Secondary | ICD-10-CM | POA: Insufficient documentation

## 2017-03-06 DIAGNOSIS — M5416 Radiculopathy, lumbar region: Secondary | ICD-10-CM | POA: Insufficient documentation

## 2017-04-30 DIAGNOSIS — M545 Low back pain: Secondary | ICD-10-CM | POA: Insufficient documentation

## 2017-04-30 DIAGNOSIS — M79605 Pain in left leg: Secondary | ICD-10-CM

## 2017-06-13 ENCOUNTER — Encounter: Payer: Self-pay | Admitting: *Deleted

## 2017-06-13 ENCOUNTER — Emergency Department (INDEPENDENT_AMBULATORY_CARE_PROVIDER_SITE_OTHER)
Admission: EM | Admit: 2017-06-13 | Discharge: 2017-06-13 | Disposition: A | Discharge status: Home / Self Care | Payer: Medicare Other | Source: Home / Self Care | Attending: Family Medicine | Admitting: Family Medicine

## 2017-06-13 DIAGNOSIS — H1032 Unspecified acute conjunctivitis, left eye: Secondary | ICD-10-CM

## 2017-06-13 DIAGNOSIS — H5789 Other specified disorders of eye and adnexa: Secondary | ICD-10-CM | POA: Diagnosis not present

## 2017-06-13 HISTORY — DX: Attention-deficit hyperactivity disorder, unspecified type: F90.9

## 2017-06-13 MED ORDER — CETIRIZINE HCL 10 MG PO TABS: 10.0000 mg | ORAL_TABLET | Freq: Every day | ORAL | 0 refills | Status: DC

## 2017-06-13 MED ORDER — POLYMYXIN B-TRIMETHOPRIM 10000-0.1 UNIT/ML-% OP SOLN: 1.0000 [drp] | OPHTHALMIC | 0 refills | Status: DC

## 2017-06-13 NOTE — ED Triage Notes (Signed)
Pt c/o LT eye pain, swelling, itching, and redness x 1 day.

## 2017-06-13 NOTE — ED Provider Notes (Signed)
[facility]    CSN: [id number] Arrival date & time: 06/13/17  1826     History   Chief Complaint Chief Complaint  Patient presents with  . Eye Problem    HPI Jasmine Hickman is a 26 y.Hickman. female.   HPI  Jasmine Hickman is a 26 y.Hickman. female presenting to UC with c/Hickman Left eye pain, itching, redness, and swelling for 1 day. Mild drainage. Pain is minimally sore and burning. No known injury. No URI symptoms. She does not wear glasses or contacts. She has not tried anything for her symptoms yet.    Past Medical History:  Diagnosis Date  . Acute thrombosis of right cephalic vein   . ADHD   . ADHD (attention deficit hyperactivity disorder)   . Asthma   . Autism   . Depression   . Esophagitis   . GERD (gastroesophageal reflux disease)   . Thyroid disease   . Ulcerative colitis [facility])     Patient Active Problem List   Diagnosis Date Noted  . FINGER PAIN 03/22/2011  . DIARRHEA 03/02/2011  . ALLERGIC RHINITIS, SEASONAL 02/03/2011    Past Surgical History:  Procedure Laterality Date  . KNEE ARTHROSCOPY      OB History    No data available       Home Medications    Prior to Admission medications   Medication Sig Start Date End Date Taking? Authorizing Provider  acetaminophen (TYLENOL) 500 MG tablet Take 500 mg by mouth every 6 (six) hours as needed.   Yes Provider, Historical, Hickman  Botulinum Toxin Type A (BOTOX) 200 units SOLR Inject as directed.   Yes Provider, Historical, Hickman  budesonide-formoterol (SYMBICORT) 160-4.5 MCG/ACT inhaler Inhale 2 puffs into the lungs 2 (two) times daily.   Yes Provider, Historical, Hickman  celecoxib (CELEBREX) 200 MG capsule Take 200 mg by mouth 2 (two) times daily.   Yes Provider, Historical, Hickman  colestipol (COLESTID) 1 g tablet Take 1 g by mouth 2 (two) times daily.   Yes Provider, Historical, Hickman  dexlansoprazole (DEXILANT) 60 MG capsule Take 60 mg by mouth daily.   Yes Provider, Historical, Hickman  dicyclomine (BENTYL) 10 MG capsule  Take 10 mg by mouth 4 (four) times daily -  before meals and at bedtime.   Yes Provider, Historical, Hickman  docusate sodium (COLACE) 100 MG capsule Take 100 mg by mouth 2 (two) times daily.   Yes Provider, Historical, Hickman  EPINEPHrine 0.3 mg/0.3 mL IJ SOAJ injection Inject into the muscle once.   Yes Provider, Historical, Hickman  sertraline (ZOLOFT) 50 MG tablet Take 50 mg by mouth daily.   Yes Provider, Historical, Hickman  triamcinolone cream (KENALOG) 0.1 % Apply 1 application topically 2 (two) times daily.   Yes Provider, Historical, Hickman  Alum & Mag Hydroxide-Simeth (GI COCKTAIL) SUSP suspension Take 30 mLs by mouth as needed for indigestion. Shake well.    Provider, Historical, Hickman  Alum & Mag Hydroxide-Simeth (GI COCKTAIL) SUSP suspension Take 30 mLs by mouth 3 (three) times daily as needed (for heartburn). Shake well. 02/05/13   Jasmine Hickman  baclofen (LIORESAL) 10 MG tablet Take 10 mg by mouth 3 (three) times daily.    Provider, Historical, Hickman  cetirizine (ZYRTEC) 10 MG tablet Take 1 tablet (10 mg total) by mouth daily. 06/13/17   Jasmine Hickman, Jasmine Beazer O, PA-C  HYDROcodone-acetaminophen (NORCO/VICODIN) 5-325 MG per tablet Take 1 tablet by mouth every 6 (six) hours as needed for pain.    Provider, Historical,  Hickman  ibuprofen (ADVIL,MOTRIN) 800 MG tablet Take 1 tablet (800 mg total) by mouth 3 (three) times daily. 03/14/13   Hickman, April, Hickman  levothyroxine (SYNTHROID, LEVOTHROID) 88 MCG tablet Take 88 mcg by mouth daily before breakfast.    Provider, Historical, Hickman  mesalamine (APRISO) 0.375 G 24 hr capsule Take 375 mg by mouth 4 (four) times daily.    Provider, Historical, Hickman  metoCLOPramide (REGLAN) 10 MG tablet Take 1 tablet (10 mg total) by mouth every 8 (eight) hours as needed (for nausea). 02/05/13   Jasmine Hickman  ondansetron (ZOFRAN) 8 MG tablet Take by mouth every 8 (eight) hours as needed for nausea.    Provider, Historical, Hickman  ondansetron (ZOFRAN-ODT) 4 MG disintegrating tablet Take 4 mg by mouth every  8 (eight) hours as needed for nausea.    Provider, Historical, Hickman  promethazine (PHENERGAN) 25 MG tablet Take 25 mg by mouth every 6 (six) hours as needed for nausea.    Provider, Historical, Hickman  sucralfate (CARAFATE) 1 G tablet Take 1 g by mouth 4 (four) times daily.    Provider, Historical, Hickman  topiramate (TOPAMAX) 25 MG tablet Take 25 mg by mouth 2 (two) times daily. 3 at night    Provider, Historical, Hickman  trimethoprim-polymyxin b (POLYTRIM) ophthalmic solution Place 1 drop into the right eye every 4 (four) hours. 06/13/17   Jasmine Shadow, PA-C    Family History Family History  Problem Relation Age of Onset  . Hyperlipidemia Mother   . Thyroid disease Mother   . Diabetes Mother   . Diabetes Father     Social History Social History  Substance Use Topics  . Smoking status: Never Smoker  . Smokeless tobacco: Never Used  . Alcohol use Yes     Comment: wine ocassionally     Allergies   Bee venom; Eggs or egg-derived products; and Other   Review of Systems Review of Systems  Constitutional: Negative for chills and fever.  HENT: Negative for congestion and ear pain.   Eyes: Positive for photophobia, pain, discharge, redness and itching. Negative for visual disturbance.  Respiratory: Negative for cough.   Neurological: Negative for dizziness, light-headedness and headaches.     Physical Exam Triage Vital Signs ED Triage Vitals [06/13/17 1849]  Enc Vitals Group     BP 113/67     Pulse Rate 78     Resp 16     Temp 98.5 F (36.9 C)     Temp Source Oral     SpO2 98 %     Weight 263 lb (119.3 kg)     Height      Head Circumference      Peak Flow      Pain Score 0     Pain Loc      Pain Edu?      Excl. in GC?    No data found.   Updated Vital Signs BP 113/67 (BP Location: Left Arm)   Pulse 78   Temp 98.5 F (36.9 C) (Oral)   Resp 16   Wt 263 lb (119.3 kg)   LMP 05/03/2017   SpO2 98%   BMI 41.19 kg/m   Visual Acuity Right Eye Distance: 20/30 Left Eye  Distance: 20/30 Bilateral Distance: 20/30 (w/Hickman correction)  Right Eye Near:   Left Eye Near:    Bilateral Near:     Physical Exam  Constitutional: She is oriented to person, place, and time. She appears well-developed and well-nourished.  No distress.  HENT:  Head: Normocephalic and atraumatic.  Eyes: Pupils are equal, round, and reactive to light. EOM are normal. Left conjunctiva is injected. Left conjunctiva has no hemorrhage.    Left eye: small amount of yellow discharge in medial canthus. Mildly injected. No surrounding erythema, edema, or tenderness.   Neck: Normal range of motion.  Cardiovascular: Normal rate.   Pulmonary/Chest: Effort normal.  Musculoskeletal: Normal range of motion.  Neurological: She is alert and oriented to person, place, and time.  Skin: Skin is warm and dry. She is not diaphoretic.  Psychiatric: She has a normal mood and affect. Her behavior is normal.  Nursing note and vitals reviewed.    UC Treatments / Results  Labs (all labs ordered are listed, but only abnormal results are displayed) Labs Reviewed - No data to display  EKG  EKG Interpretation None       Radiology No results found.  Procedures Procedures (including critical care time)  Medications Ordered in UC Medications - No data to display   Initial Impression / Assessment and Plan / UC Course  I have reviewed the triage vital signs and the nursing notes.  Pertinent labs & imaging results that were available during my care of the patient were reviewed by me and considered in my medical decision making (see chart for details).      Hx and exam c/w conjunctivitis  Possible early bacterial conjunctivitis Home care instructions provided F/u with PCP in 4-5 days if not improving, sooner if worsening.  Final Clinical Impressions(s) / UC Diagnoses   Final diagnoses:  Irritation of left eye  Acute conjunctivitis of left eye, unspecified acute conjunctivitis type    New  Prescriptions Discharge Medication List as of 06/13/2017  6:56 PM    START taking these medications   Details  cetirizine (ZYRTEC) 10 MG tablet Take 1 tablet (10 mg total) by mouth daily., Starting Fri 06/13/2017, Normal    trimethoprim-polymyxin b (POLYTRIM) ophthalmic solution Place 1 drop into the right eye every 4 (four) hours., Starting Fri 06/13/2017, Print         Controlled Substance Prescriptions [place] Controlled Substance Registry consulted? Not Applicable   Rolla Plate 06/13/17 1935

## 2017-08-26 HISTORY — PX: BACK SURGERY: SHX140

## 2017-11-26 DIAGNOSIS — Z79890 Hormone replacement therapy (postmenopausal): Secondary | ICD-10-CM | POA: Insufficient documentation

## 2017-12-18 DIAGNOSIS — E282 Polycystic ovarian syndrome: Secondary | ICD-10-CM | POA: Insufficient documentation

## 2018-01-16 ENCOUNTER — Encounter: Payer: Self-pay | Admitting: Emergency Medicine

## 2018-01-16 ENCOUNTER — Emergency Department (INDEPENDENT_AMBULATORY_CARE_PROVIDER_SITE_OTHER)
Admission: EM | Admit: 2018-01-16 | Discharge: 2018-01-16 | Disposition: A | Discharge status: Home / Self Care | Payer: Medicare HMO | Source: Home / Self Care

## 2018-01-16 DIAGNOSIS — J40 Bronchitis, not specified as acute or chronic: Secondary | ICD-10-CM | POA: Diagnosis not present

## 2018-01-16 MED ORDER — BENZONATATE 100 MG PO CAPS: 100.0000 mg | ORAL_CAPSULE | Freq: Three times a day (TID) | ORAL | 0 refills | Status: DC

## 2018-01-16 MED ORDER — ALBUTEROL SULFATE HFA 108 (90 BASE) MCG/ACT IN AERS: 1.0000 | INHALATION_SPRAY | Freq: Four times a day (QID) | RESPIRATORY_TRACT | 0 refills | Status: AC | PRN

## 2018-01-16 NOTE — ED Provider Notes (Signed)
[facility]   [id number] 01/16/18 Arrival Time: 1457   SUBJECTIVE:  Jasmine Hickman is a 27 y.o. female who presents to the urgent care with complaint of cough, nonproductive and not associated with fever.  Patient does have a history of asthma in the past.  Patient has been prescribed an inhaler in the past but cannot find it.  She has no ear pain or sinus congestion, no chest pain or shortness of breath.   Past Medical History:  Diagnosis Date  . Acute thrombosis of right cephalic vein   . ADHD   . ADHD (attention deficit hyperactivity disorder)   . Asthma   . Autism   . Depression   . Esophagitis   . GERD (gastroesophageal reflux disease)   . Thyroid disease   . Ulcerative colitis (HCC)    Family History  Problem Relation Age of Onset  . Hyperlipidemia Mother   . Thyroid disease Mother   . Diabetes Mother   . Diabetes Father    Social History   Socioeconomic History  . Marital status: Single    Spouse name: Not on file  . Number of children: Not on file  . Years of education: Not on file  . Highest education level: Not on file  Occupational History  . Not on file  Social Needs  . Financial resource strain: Not on file  . Food insecurity:    Worry: Not on file    Inability: Not on file  . Transportation needs:    Medical: Not on file    Non-medical: Not on file  Tobacco Use  . Smoking status: Never Smoker  . Smokeless tobacco: Never Used  Substance and Sexual Activity  . Alcohol use: Yes    Comment: wine ocassionally  . Drug use: No  . Sexual activity: Not on file  Lifestyle  . Physical activity:    Days per week: Not on file    Minutes per session: Not on file  . Stress: Not on file  Relationships  . Social connections:    Talks on phone: Not on file    Gets together: Not on file    Attends religious service: Not on file    Active member of club or organization: Not on file    Attends meetings of clubs or organizations: Not on file   Relationship status: Not on file  . Intimate partner violence:    Fear of current or ex partner: Not on file    Emotionally abused: Not on file    Physically abused: Not on file    Forced sexual activity: Not on file  Other Topics Concern  . Not on file  Social History Narrative  . Not on file   No outpatient medications have been marked as taking for the 01/16/18 encounter [facility] Encounter).   Allergies  Allergen Reactions  . Bee Venom   . Eggs Or Egg-Derived Products     Egg whites  . Other     Pretzel, Epidural steroid injection       ROS: As per HPI, remainder of ROS negative.   OBJECTIVE:   Vitals:   01/16/18 1539  BP: 114/76  Pulse: 93  Temp: 98.2 F (36.8 C)  TempSrc: Oral  SpO2: 98%  Weight: 283 lb (128.4 kg)     General appearance: alert; no distress Eyes: PERRL; EOMI; conjunctiva normal HENT: normocephalic; atraumatic; TMs normal, canal normal, external ears normal without trauma; ; oral mucosa normal Neck: supple Lungs: Faint  expiratory wheeze on auscultation bilaterally Heart: regular rate and rhythm Back: no CVA tenderness Extremities: no cyanosis or edema; symmetrical with no gross deformities Skin: warm and dry Neurologic: normal gait; grossly normal Psychological: alert and cooperative; normal mood and affect      Labs:  Results for orders placed or performed during the hospital encounter of 11/13/13  TSH  Result Value Ref Range   TSH 3.609 0.350 - 4.500 uIU/mL  T3, free  Result Value Ref Range   T3, Free 3.5 2.3 - 4.2 pg/mL  Vitamin B12  Result Value Ref Range   Vitamin B-12 200 (L) 211 - 911 pg/mL    Labs Reviewed - No data to display  No results found.     ASSESSMENT & PLAN:  1. Bronchitis   You should see improvement in the next 24 to 48 hours.  Review the technique for using the inhaler which I have enclosed in this document.  If you develop fever, shortness of breath or worsening cough, feel free to return for  reevaluation.  The cause of your cough is most likely a virus which should resolve in the next 2 to 3 days.  Often the hotmuggy weather that were having makes breathing more difficult and the cough worse.  Staying in air conditioning often helps relieve some of the symptoms   Meds ordered this encounter  Medications  . albuterol (PROVENTIL HFA;VENTOLIN HFA) 108 (90 Base) MCG/ACT inhaler    Sig: Inhale 1-2 puffs into the lungs every 6 (six) hours as needed for wheezing or shortness of breath.    Dispense:  1 Inhaler    Refill:  0  . benzonatate (TESSALON) 100 MG capsule    Sig: Take 1 capsule (100 mg total) by mouth every 8 (eight) hours.    Dispense:  21 capsule    Refill:  0    Reviewed expectations re: course of current medical issues. Questions answered. Outlined signs and symptoms indicating need for more acute intervention. Patient verbalized understanding. After Visit Summary given.    Procedures:      Elvina Sidle, MD 01/16/18 1551

## 2018-01-16 NOTE — Discharge Instructions (Addendum)
You should see improvement in the next 24 to 48 hours.  Review the technique for using the inhaler which I have enclosed in this document.  If you develop fever, shortness of breath or worsening cough, feel free to return for reevaluation.  The cause of your cough is most likely a virus which should resolve in the next 2 to 3 days.  Often the hotmuggy weather that were having makes breathing more difficult and the cough worse.  Staying in air conditioning often helps relieve some of the symptoms

## 2018-01-16 NOTE — ED Triage Notes (Signed)
Pt c/o dry cough x2 days. Afebrile. States OTC meds do not help.

## 2018-02-02 ENCOUNTER — Emergency Department: Admission: EM | Admit: 2018-02-02 | Discharge: 2018-02-02 | Discharge status: Absent without leave | Payer: Self-pay

## 2018-03-18 ENCOUNTER — Emergency Department (INDEPENDENT_AMBULATORY_CARE_PROVIDER_SITE_OTHER)
Admission: EM | Admit: 2018-03-18 | Discharge: 2018-03-18 | Disposition: A | Discharge status: Home / Self Care | Payer: Medicare HMO | Source: Home / Self Care | Attending: Family Medicine | Admitting: Family Medicine

## 2018-03-18 ENCOUNTER — Other Ambulatory Visit: Payer: Self-pay

## 2018-03-18 ENCOUNTER — Encounter: Payer: Self-pay | Admitting: *Deleted

## 2018-03-18 DIAGNOSIS — G8929 Other chronic pain: Secondary | ICD-10-CM | POA: Diagnosis not present

## 2018-03-18 DIAGNOSIS — M5441 Lumbago with sciatica, right side: Secondary | ICD-10-CM | POA: Diagnosis not present

## 2018-03-18 MED ORDER — HYDROCODONE-ACETAMINOPHEN 5-325 MG PO TABS: ORAL_TABLET | ORAL | 0 refills | Status: DC

## 2018-03-18 MED ORDER — PREDNISONE 20 MG PO TABS: ORAL_TABLET | ORAL | 0 refills | Status: DC

## 2018-03-18 NOTE — Discharge Instructions (Signed)
Apply ice pack for 20 to 30 minutes, 3 to 4 times daily.
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
## 2018-03-19 ENCOUNTER — Encounter (HOSPITAL_BASED_OUTPATIENT_CLINIC_OR_DEPARTMENT_OTHER): Payer: Self-pay | Admitting: Emergency Medicine

## 2018-03-19 ENCOUNTER — Emergency Department (HOSPITAL_BASED_OUTPATIENT_CLINIC_OR_DEPARTMENT_OTHER)
Admission: EM | Admit: 2018-03-19 | Discharge: 2018-03-20 | Disposition: A | Discharge status: Home / Self Care | Payer: Medicare HMO | Attending: Emergency Medicine | Admitting: Emergency Medicine

## 2018-03-19 ENCOUNTER — Other Ambulatory Visit: Payer: Self-pay

## 2018-03-19 ENCOUNTER — Emergency Department (HOSPITAL_BASED_OUTPATIENT_CLINIC_OR_DEPARTMENT_OTHER): Payer: Medicare HMO

## 2018-03-19 DIAGNOSIS — Z79899 Other long term (current) drug therapy: Secondary | ICD-10-CM | POA: Insufficient documentation

## 2018-03-19 DIAGNOSIS — M5441 Lumbago with sciatica, right side: Secondary | ICD-10-CM | POA: Insufficient documentation

## 2018-03-19 DIAGNOSIS — M545 Low back pain: Secondary | ICD-10-CM | POA: Diagnosis present

## 2018-03-19 DIAGNOSIS — G8929 Other chronic pain: Secondary | ICD-10-CM | POA: Diagnosis not present

## 2018-03-19 DIAGNOSIS — E079 Disorder of thyroid, unspecified: Secondary | ICD-10-CM | POA: Diagnosis not present

## 2018-03-19 DIAGNOSIS — N3000 Acute cystitis without hematuria: Secondary | ICD-10-CM | POA: Diagnosis not present

## 2018-03-19 DIAGNOSIS — R350 Frequency of micturition: Secondary | ICD-10-CM | POA: Diagnosis not present

## 2018-03-19 LAB — URINALYSIS, ROUTINE W REFLEX MICROSCOPIC
Bilirubin Urine: NEGATIVE
Glucose, UA: NEGATIVE mg/dL
Hgb urine dipstick: NEGATIVE
Ketones, ur: NEGATIVE mg/dL
Nitrite: NEGATIVE
Protein, ur: NEGATIVE mg/dL
Specific Gravity, Urine: 1.015 (ref 1.005–1.030)
pH: 6 (ref 5.0–8.0)

## 2018-03-19 LAB — URINALYSIS, MICROSCOPIC (REFLEX)

## 2018-03-19 LAB — PREGNANCY, URINE: PREG TEST UR: NEGATIVE

## 2018-03-19 NOTE — ED Triage Notes (Signed)
Pt is c/o chronic back pain that she states got worse last month  [facility] was [facility] [place]th for the same   She states they really did not do much for her

## 2018-03-19 NOTE — ED Notes (Signed)
Patient transported to X-ray 

## 2018-03-20 MED ORDER — LIDOCAINE 5 % EX PTCH: 1.0000 | MEDICATED_PATCH | CUTANEOUS | 0 refills | Status: DC

## 2018-03-20 MED ORDER — CEPHALEXIN 250 MG PO CAPS: 250.0000 mg | ORAL_CAPSULE | Freq: Four times a day (QID) | ORAL | 0 refills | Status: AC

## 2018-03-20 MED ORDER — CEPHALEXIN 250 MG PO CAPS: 250.0000 mg | ORAL_CAPSULE | Freq: Four times a day (QID) | ORAL | 0 refills | Status: DC
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
## 2018-03-20 NOTE — Discharge Instructions (Signed)
Continue taking home medications as prescribed. Take antibiotics.  Take the entire course, even if your symptoms improve. Use lidocaine patches as needed for pain. It is very important that you follow-up with orthopedic doctor for further evaluation management of your back pain. Return to the emergency room with any new, worsening, or concerning symptoms.

## 2018-03-23 ENCOUNTER — Ambulatory Visit (INDEPENDENT_AMBULATORY_CARE_PROVIDER_SITE_OTHER): Payer: Medicare HMO | Admitting: Family Medicine

## 2018-03-23 ENCOUNTER — Encounter: Payer: Self-pay | Admitting: Family Medicine

## 2018-03-23 VITALS — BP 116/81 | HR 91 | Wt 290.0 lb

## 2018-03-23 DIAGNOSIS — M5441 Lumbago with sciatica, right side: Secondary | ICD-10-CM

## 2018-03-23 DIAGNOSIS — F909 Attention-deficit hyperactivity disorder, unspecified type: Secondary | ICD-10-CM | POA: Insufficient documentation

## 2018-03-23 DIAGNOSIS — N643 Galactorrhea not associated with childbirth: Secondary | ICD-10-CM | POA: Insufficient documentation

## 2018-03-23 DIAGNOSIS — E559 Vitamin D deficiency, unspecified: Secondary | ICD-10-CM | POA: Insufficient documentation

## 2018-03-23 MED ORDER — GABAPENTIN 300 MG PO CAPS: ORAL_CAPSULE | ORAL | 3 refills | Status: DC

## 2018-03-23 NOTE — Patient Instructions (Signed)
Thank you for coming in today. Attend home health PT.  Gabapentin for pain down the leg up to 3x daily.  Recheck with me in 2 weeks.  Let me know if you have problems.   .Come back or go to the emergency room if you notice new weakness new numbness problems walking or bowel or bladder problems.   Sciatica Sciatica is pain, numbness, weakness, or tingling along your sciatic nerve. The sciatic nerve starts in the lower back and goes down the back of each leg. Sciatica happens when this nerve is pinched or has pressure put on it. Sciatica usually goes away on its own or with treatment. Sometimes, sciatica may keep coming back (recur). Follow these instructions at home: Medicines  Take over-the-counter and prescription medicines only as told by your doctor.  Do not drive or use heavy machinery while taking prescription pain medicine. Managing pain  If directed, put ice on the affected area. ? Put ice in a plastic bag. ? Place a towel between your skin and the bag. ? Leave the ice on for 20 minutes, 2-3 times a day.  After icing, apply heat to the affected area before you exercise or as often as told by your doctor. Use the heat source that your doctor tells you to use, such as a moist heat pack or a heating pad. ? Place a towel between your skin and the heat source. ? Leave the heat on for 20-30 minutes. ? Remove the heat if your skin turns bright red. This is especially important if you are unable to feel pain, heat, or cold. You may have a greater risk of getting burned. Activity  Return to your normal activities as told by your doctor. Ask your doctor what activities are safe for you. ? Avoid activities that make your sciatica worse.  Take short rests during the day. Rest in a lying or standing position. This is usually better than sitting to rest. ? When you rest for a long time, do some physical activity or stretching between periods of rest. ? Avoid sitting for a long time  without moving. Get up and move around at least one time each hour.  Exercise and stretch regularly, as told by your doctor.  Do not lift anything that is heavier than 10 lb (4.5 kg) while you have symptoms of sciatica. ? Avoid lifting heavy things even when you do not have symptoms. ? Avoid lifting heavy things over and over.  When you lift objects, always lift in a way that is safe for your body. To do this, you should: ? Bend your knees. ? Keep the object close to your body. ? Avoid twisting. General instructions  Use good posture. ? Avoid leaning forward when you are sitting. ? Avoid hunching over when you are standing.  Stay at a healthy weight.  Wear comfortable shoes that support your feet. Avoid wearing high heels.  Avoid sleeping on a mattress that is too soft or too hard. You might have less pain if you sleep on a mattress that is firm enough to support your back.  Keep all follow-up visits as told by your doctor. This is important. Contact a doctor if:  You have pain that: ? Wakes you up when you are sleeping. ? Gets worse when you lie down. ? Is worse than the pain you have had in the past. ? Lasts longer than 4 weeks.  You lose weight for without trying. Get help right away if:  You  cannot control when you pee (urinate) or poop (have a bowel movement).  You have weakness in any of these areas and it gets worse. ? Lower back. ? Lower belly (pelvis). ? Butt (buttocks). ? Legs.  You have redness or swelling of your back.  You have a burning feeling when you pee. This information is not intended to replace advice given to you by your health care provider. Make sure you discuss any questions you have with your health care provider. Document Released: 05/21/2008 Document Revised: 01/18/2016 Document Reviewed: 04/21/2015 Elsevier Interactive Patient Education  [employer].
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
## 2018-03-30 ENCOUNTER — Telehealth: Payer: Self-pay

## 2018-03-30 NOTE — Telephone Encounter (Signed)
Pt's mother called stating that several weeks ago pt received a [facility] shot [facility]. At site of injection, pt is now experiencing pain down her leg and it is also swelling.   I asked pt mother if this reaction was going on at her appt last week when she saw Dr Denyse Amassorey and she reports yes it was but they forgot to bring it up.  I advised pt's mother to take pt to Urgent Care to get evaluated since pain is still happening, despite icing and rest. No appt available in our office today and Dr Denyse Amassorey out of office this week.  Pt's mother agreeable and will take pt to get evaluated.

## 2018-04-07 ENCOUNTER — Encounter: Payer: Self-pay | Admitting: Family Medicine

## 2018-04-07 ENCOUNTER — Ambulatory Visit (INDEPENDENT_AMBULATORY_CARE_PROVIDER_SITE_OTHER): Payer: Medicare HMO | Admitting: Family Medicine

## 2018-04-07 VITALS — BP 134/70 | HR 80 | Wt 287.0 lb

## 2018-04-07 DIAGNOSIS — M5416 Radiculopathy, lumbar region: Secondary | ICD-10-CM

## 2018-04-07 DIAGNOSIS — M5441 Lumbago with sciatica, right side: Secondary | ICD-10-CM

## 2018-04-07 DIAGNOSIS — F71 Moderate intellectual disabilities: Secondary | ICD-10-CM | POA: Diagnosis not present

## 2018-04-07 NOTE — Patient Instructions (Addendum)
Thank you for coming in today.  [facility].  Let me know if you cant get to appointments especially the MRI one.  Let me know if noone calls you.   I will put mom's number down.   You should hear about MRI scheduling in the next few days.  If you do not hear anything let me know.   I think we will like be setting up a back injection.    Epidural Steroid Injection An epidural steroid injection is a shot of steroid medicine and numbing medicine that is given into the space between the spinal cord and the bones in your back (epidural space). The shot helps relieve pain caused by an irritated or swollen nerve root. The amount of pain relief you get from the injection depends on what is causing the nerve to be swollen and irritated, and how long your pain lasts. You are more likely to benefit from this injection if your pain is strong and comes on suddenly rather than if you have had pain for a long time. Tell a health care provider about:  Any allergies you have.  All medicines you are taking, including vitamins, herbs, eye drops, creams, and over-the-counter medicines.  Any problems you or family members have had with anesthetic medicines.  Any blood disorders you have.  Any surgeries you have had.  Any medical conditions you have.  Whether you are pregnant or may be pregnant. What are the risks? Generally, this is a safe procedure. However, problems may occur, including:  Headache.  Bleeding.  Infection.  Allergic reaction to medicines.  Damage to your nerves.  What happens before the procedure? Staying hydrated Follow instructions from your health care provider about hydration, which may include:  Up to 2 hours before the procedure - you may continue to drink clear liquids, such as water, clear fruit juice, black coffee, and plain tea.  Eating and drinking restrictions Follow instructions from your health care provider about eating  and drinking, which may include:  8 hours before the procedure - stop eating heavy meals or foods such as meat, fried foods, or fatty foods.  6 hours before the procedure - stop eating light meals or foods, such as toast or cereal.  6 hours before the procedure - stop drinking milk or drinks that contain milk.  2 hours before the procedure - stop drinking clear liquids.  Medicine  You may be given medicines to lower anxiety.  Ask your health care provider about: ? Changing or stopping your regular medicines. This is especially important if you are taking diabetes medicines or blood thinners. ? Taking medicines such as aspirin and ibuprofen. These medicines can thin your blood. Do not take these medicines before your procedure if your health care provider instructs you not to. General instructions  Plan to have someone take you home from the hospital or clinic. What happens during the procedure?  You may receive a medicine to help you relax (sedative).  You will be asked to lie on your abdomen.  The injection site will be cleaned.  A numbing medicine (local anesthetic) will be used to numb the injection site.  A needle will be inserted through your skin into the epidural space. You may feel some discomfort when this happens. An X-ray machine will be used to make sure the needle is put as close as possible to the affected nerve.  A steroid medicine and a local anesthetic will be injected into  the epidural space.  The needle will be removed.  A bandage (dressing) will be put over the injection site. What happens after the procedure?  Your blood pressure, heart rate, breathing rate, and blood oxygen level will be monitored until the medicines you were given have worn off.  Your arm or leg may feel weak or numb for a few hours.  The injection site may feel sore.  Do not drive for 24 hours if you received a sedative. This information is not intended to replace advice given to  you by your health care provider. Make sure you discuss any questions you have with your health care provider. Document Released: 11/19/2007 Document Revised: 01/24/2016 Document Reviewed: 11/28/2015 Elsevier Interactive Patient Education  [employer]2018 [employer]

## 2018-04-07 NOTE — Progress Notes (Signed)
Jasmine Hickman is a 27 y.o. female who presents to [facility] today for back pain with right leg radicular pain.  Jasmine Hickman was seen July 29 for this issue.  At that time her pain had been ongoing for approximately 4 weeks.  This is worsening above her baseline of mild chronic back pain.  She is never had a significant work-up for this including MRI.  She notes pain radiating down her right leg.  She denies significant weakness but notes that she been tripping and falling more recently.  She denies bowel or bladder dysfunction.  Pain in her back and leg are worse with prolonged standing and rising from a seated position.  She is having significant difficulty with transportation.  She has Medicare Medicaid and is dependent on her family for transportation.  They also do not have significant resources and did not possess her car.  Transportation is very challenging to get her to her appointments.  She had to walk around a mile in the summer days recently to get to medical appointments which made her back and leg pain worse.    ROS:  As above  Exam:  BP 134/70   Pulse 80   Wt 287 lb (130.2 kg)   BMI 43.64 kg/m  General: Well Developed, well nourished, and in no acute distress.  Neuro/Psych: Alert and oriented x3, extra-ocular muscles intact, able to move all 4 extremities, sensation grossly intact. Skin: Warm and dry, no rashes noted.  Respiratory: Not using accessory muscles, speaking in full sentences, trachea midline.  Cardiovascular: Pulses palpable, no extremity edema. Abdomen: Does not appear distended. MSK:  L-spine: Nontender to spinal midline.  Tender palpation right lumbar paraspinal muscle group. Lumbar motion limited and painful with flexion. Lower extremity strength is equal throughout bilateral knee with the exception of the right knee extension which is 4+/5. Reflexes are equal and normal bilaterally. Sensation decreased right leg in  a L4 and L5 nerve dermatome pattern.  Otherwise bilateral lower extremities. Capillary refill are intact bilaterally lower extremities   Lab and Radiology Results CT [facility] abdomen [facility] reviewed.  No acute bony abnormalities of the lumbar spine present.    Assessment and Plan: 27 y.o. female with  Low back pain with right-sided radicular pain likely L4 or L5 nerve root dermatome. Patient is failing conservative management including home health physical therapy, oral NSAIDs and steroids and gabapentin.  Plan for Plan for MRI of L-spine to evaluate for epidural steroid injection planning and to evaluate right-sided leg weakness.  Difficulty with transportation.  This is a significant psychosocial medical problem.  [facility].  I am hopeful to be able to arrange some transportation if not also discussed with home health agency and potentially refer to other social worker agencies.  Evette CristalShin and mother will keep me updated.  I spent 25 minutes with this patient, greater than 50% was face-to-face time counseling regarding ddx and plan.   Orders Placed This Encounter  Procedures  . MR Lumbar Spine Wo Contrast    Standing Status:   Future    Standing Expiration Date:   06/08/2019    Order Specific Question:   What is the patient's sedation requirement?    Answer:   No Sedation    Order Specific Question:   Does the patient have a pacemaker or implanted devices?    Answer:   No    Order Specific  Question:   Preferred imaging location?    Answer:   [facility]ensed [facility] (table limit-350lbs)    Order Specific Question:   Radiology Contrast Protocol - do NOT remove file path    Answer:   [email or web address]  . AMB Referral to [facility] Care Management    Referral Priority:   Routine    Referral Type:   Consultation    Referral Reason:   [facility]-Care Management    Number of Visits Requested:   1   No orders of the  defined types were placed in this encounter.   Historical information moved to improve visibility of documentation.  Past Medical History:  Diagnosis Date  . Acute thrombosis of right cephalic vein   . ADHD   . ADHD (attention deficit hyperactivity disorder)   . Asthma   . Autism   . Depression   . Esophagitis   . GERD (gastroesophageal reflux disease)   . Thyroid disease   . Ulcerative colitis [facility])    Past Surgical History:  Procedure Laterality Date  . KNEE ARTHROSCOPY     Social History   Tobacco Use  . Smoking status: Never Smoker  . Smokeless tobacco: Never Used  Substance Use Topics  . Alcohol use: Yes    Comment: wine ocassionally   family history includes Diabetes in her father and mother; Hyperlipidemia in her mother; Thyroid disease in her mother.  Medications: Current Outpatient Medications  Medication Sig Dispense Refill  . acetaminophen (TYLENOL) 500 MG tablet Take 500 mg by mouth every 6 (six) hours as needed.    Marland Kitchen albuterol (PROVENTIL HFA;VENTOLIN HFA) 108 (90 Base) MCG/ACT inhaler Inhale 1-2 puffs into the lungs every 6 (six) hours as needed for wheezing or shortness of breath. 1 Inhaler 0  . baclofen (LIORESAL) 10 MG tablet Take 10 mg by mouth 3 (three) times daily.    . Botulinum Toxin Type A (BOTOX) 200 units SOLR Inject as directed.    . budesonide-formoterol (SYMBICORT) 160-4.5 MCG/ACT inhaler Inhale 2 puffs into the lungs 2 (two) times daily.    . celecoxib (CELEBREX) 200 MG capsule Take 200 mg by mouth 2 (two) times daily.    . cetirizine (ZYRTEC) 10 MG tablet Take 1 tablet (10 mg total) by mouth daily. 30 tablet 0  . colestipol (COLESTID) 1 g tablet Take 1 g by mouth 2 (two) times daily.    Marland Kitchen dexlansoprazole (DEXILANT) 60 MG capsule Take 60 mg by mouth daily.    Marland Kitchen dicyclomine (BENTYL) 10 MG capsule Take 10 mg by mouth 4 (four) times daily -  before meals and at bedtime.    . docusate sodium (COLACE) 100 MG capsule Take 100 mg by mouth 2 (two)  times daily.    Marland Kitchen EPINEPHrine 0.3 mg/0.3 mL IJ SOAJ injection Inject into the muscle once.    . gabapentin (NEURONTIN) 300 MG capsule One tab PO qHS for a week, then BID for a week, then TID. May double weekly to a max of 3,600mg /day 180 capsule 3  . ibuprofen (ADVIL,MOTRIN) 800 MG tablet Take 1 tablet (800 mg total) by mouth 3 (three) times daily. 21 tablet 0  . levothyroxine (SYNTHROID, LEVOTHROID) 88 MCG tablet Take 88 mcg by mouth daily before breakfast.    . lidocaine (LIDODERM) 5 % Place 1 patch onto the skin daily. Remove & Discard patch within 12 hours or as directed by MD 30 patch 0  . mesalamine (APRISO) 0.375 G 24 hr capsule Take 375 mg by mouth 4 (four)  times daily.    . metoCLOPramide (REGLAN) 10 MG tablet Take 1 tablet (10 mg total) by mouth every 8 (eight) hours as needed (for nausea). 10 tablet 0  . ondansetron (ZOFRAN) 8 MG tablet Take by mouth every 8 (eight) hours as needed for nausea.    . ondansetron (ZOFRAN-ODT) 4 MG disintegrating tablet Take 4 mg by mouth every 8 (eight) hours as needed for nausea.    . promethazine (PHENERGAN) 25 MG tablet Take 25 mg by mouth every 6 (six) hours as needed for nausea.    . sertraline (ZOLOFT) 50 MG tablet Take 50 mg by mouth daily.    . sucralfate (CARAFATE) 1 G tablet Take 1 g by mouth 4 (four) times daily.    Marland Kitchen. topiramate (TOPAMAX) 25 MG tablet Take 25 mg by mouth 2 (two) times daily. 3 at night    . triamcinolone cream (KENALOG) 0.1 % Apply 1 application topically 2 (two) times daily.     No current facility-administered medications for this visit.    Allergies  Allergen Reactions  . Bee Venom   . Eggs Or Egg-Derived Products     Egg whites  . Other     Pretzel, Epidural steroid injection       Discussed warning signs or symptoms. Please see discharge instructions. Patient expresses understanding.

## 2018-04-08 ENCOUNTER — Encounter: Payer: Self-pay | Admitting: Family Medicine

## 2018-04-09 ENCOUNTER — Encounter: Payer: Self-pay | Admitting: Family Medicine

## 2018-04-09 ENCOUNTER — Ambulatory Visit: Payer: Medicare HMO | Admitting: Family Medicine

## 2018-04-09 MED ORDER — ONDANSETRON HCL 8 MG PO TABS: ORAL_TABLET | ORAL | 0 refills | Status: DC

## 2018-04-22 ENCOUNTER — Encounter: Payer: Self-pay | Admitting: Family Medicine

## 2018-04-28 ENCOUNTER — Encounter: Payer: Self-pay | Admitting: Family Medicine

## 2018-04-28 ENCOUNTER — Ambulatory Visit (INDEPENDENT_AMBULATORY_CARE_PROVIDER_SITE_OTHER): Payer: Medicare HMO

## 2018-04-28 ENCOUNTER — Telehealth: Payer: Self-pay | Admitting: Family Medicine

## 2018-04-28 DIAGNOSIS — M5117 Intervertebral disc disorders with radiculopathy, lumbosacral region: Secondary | ICD-10-CM

## 2018-04-28 DIAGNOSIS — M5441 Lumbago with sciatica, right side: Secondary | ICD-10-CM

## 2018-04-28 DIAGNOSIS — F71 Moderate intellectual disabilities: Secondary | ICD-10-CM

## 2018-04-28 DIAGNOSIS — M5416 Radiculopathy, lumbar region: Secondary | ICD-10-CM

## 2018-04-28 DIAGNOSIS — M5116 Intervertebral disc disorders with radiculopathy, lumbar region: Secondary | ICD-10-CM | POA: Diagnosis not present

## 2018-04-28 DIAGNOSIS — M4807 Spinal stenosis, lumbosacral region: Secondary | ICD-10-CM

## 2018-04-28 NOTE — Telephone Encounter (Signed)
MRI shows a large bulging disc.  This could certainly cause the pain that you are having.  Plan for back injections.  Please schedule appointment with me in the near future to discuss the results.

## 2018-04-29 NOTE — Telephone Encounter (Signed)
Pt reviewed results on MyChart. Discussed results with pt and her mother. Appt scheduled on Friday to review results with Dr Denyse Amass

## 2018-04-30 ENCOUNTER — Encounter: Payer: Self-pay | Admitting: Family Medicine

## 2018-04-30 ENCOUNTER — Telehealth: Payer: Self-pay

## 2018-04-30 NOTE — Telephone Encounter (Signed)
Left VM for Jasmine Hickman, Presenter, broadcasting, with the [facility].  THE [facility] OF [place] [address] [place], [place] [address] [phone]  Waiting for callback to see if they would be available to assist Patient.

## 2018-04-30 NOTE — Telephone Encounter (Signed)
Pt's mother left very broken-up msg stating that pt is having some issues with pain and not being able to get comfortable. Reports that Gabapentin is not working very well. Pt is scheduled for appt tomorrow.  I called pt's mother back and left msg asking that she call and state if there are any concerns or needs that need to be relayed to Dr Denyse Amass now, or if pt wants to wait and discuss things at visit tomorrow. Also asked if there was more tot the message that I was not able to understand due to poor connection issues.   Call back info provided

## 2018-04-30 NOTE — Telephone Encounter (Signed)
We will work to arrange for a taxi or uber. This is not something we can always do.  We want to make sure you get an appointment.

## 2018-04-30 NOTE — Telephone Encounter (Signed)
Pt's mother called back. States she is in a lot of pain, cannot move without pain. They are having issues finding transportation to appt tomorrow and want to know if there is anything Dr Denyse Amass can do? Gabapentin not working. Pt cannot get injection until September 16th.  Please advise

## 2018-05-01 ENCOUNTER — Encounter: Payer: Self-pay | Admitting: Family Medicine

## 2018-05-01 ENCOUNTER — Ambulatory Visit (INDEPENDENT_AMBULATORY_CARE_PROVIDER_SITE_OTHER): Payer: Medicare HMO | Admitting: Family Medicine

## 2018-05-01 VITALS — BP 129/84 | HR 101 | Temp 97.5°F | Wt 289.0 lb

## 2018-05-01 DIAGNOSIS — R29898 Other symptoms and signs involving the musculoskeletal system: Secondary | ICD-10-CM

## 2018-05-01 DIAGNOSIS — M5416 Radiculopathy, lumbar region: Secondary | ICD-10-CM

## 2018-05-01 MED ORDER — HYDROCODONE-ACETAMINOPHEN 5-325 MG PO TABS: 1.0000 | ORAL_TABLET | Freq: Four times a day (QID) | ORAL | 0 refills | Status: DC | PRN

## 2018-05-01 MED ORDER — GABAPENTIN 300 MG PO CAPS: 900.0000 mg | ORAL_CAPSULE | Freq: Three times a day (TID) | ORAL | 1 refills | Status: DC

## 2018-05-01 NOTE — Progress Notes (Signed)
Faxed and confirmation received.

## 2018-05-01 NOTE — Patient Instructions (Signed)
Thank you for coming in today. Use hydrocodone sparingly.  Increase gabapentin to 3 pills 3x daily.  Get your back injection.  Schedule neurosurgery appointment after back injection.   Recheck with me as needed.   Keep me updated.   Come back or go to the emergency room if you notice new weakness new numbness problems walking or bowel or bladder problems.

## 2018-05-01 NOTE — Progress Notes (Signed)
Jasmine Hickman is a 27 y.o. female who presents to [facility] today for follow-up back pain with radiculopathy.  Jasmine Hickman has been seen several times over the past 2 months for back pain with pain radiating down her right leg.  She had failed initial trials of conservative management including oral prednisone, gabapentin, and home health physical therapy.  She had an MRI ordered and obtained on September 3.  She is here today for follow-up.  She notes that she has severe low back pain with pain radiating down her right leg.  She notes that she has some subjective leg weakness and has been falling and tripping more frequently.  She is quite miserable with her pain and notes that even gabapentin at near max dose is not sufficient to control pain.  She has had courses of prednisone that did not help and notes that the home of physical therapy has not been very helpful.  Patient notes that she continues to have some barriers to getting good care.  She does not have reliable transportation.  She does have both Medicare and Medicaid.  ROS:  As above  Exam:  BP 129/84   Pulse (!) 101   Temp (!) 97.5 F (36.4 C) (Oral)   Wt 289 lb (131.1 kg)   BMI 43.94 kg/m  General: Well Developed, well nourished, and in no acute distress.  Neuro/Psych: Alert and oriented x3, extra-ocular muscles intact, able to move all 4 extremities, sensation grossly intact. Skin: Warm and dry, no rashes noted.  Respiratory: Not using accessory muscles, speaking in full sentences, trachea midline.  Cardiovascular: Pulses palpable, no extremity edema. Abdomen: Does not appear distended. MSK: Lspine: Non-tender to spinal midline.  TTP BL lumbar midline Decrease lumbar ROM flexion due to pain.  Normal extension Decreased lumbar ROM to rotation and lateral flexion BL.   Positive slump test bilaterally.  Reflexes intact bilateral lower extremities knees and ankles.  Sensation  intact throughout.  Intact bilateral lower extremities with exception of Right foot dorsiflexion and great toe dorsiflexion diminished 4/5. Left foot dorsiflexion and great toe dorsiflexion diminished 4+/5. Foot plantar flexion is normal bilaterally 5/5    Lab and Radiology Results No results found for this or any previous visit (from the past 72 hour(s)). Mr Lumbar Spine Wo Contrast  Result Date: 04/28/2018 CLINICAL DATA:  Acute right-sided low back pain with right-sided sciatica. Lumbar radiculopathy. EXAM: MRI LUMBAR SPINE WITHOUT CONTRAST TECHNIQUE: Multiplanar, multisequence MR imaging of the lumbar spine was performed. No intravenous contrast was administered. COMPARISON:  Lumbar spine radiographs 03/19/2018 FINDINGS: Segmentation: 5 non rib-bearing lumbar type vertebral bodies are present. Alignment:  AP alignment is anatomic. Vertebrae:  Endplate Schmorl's nodes present T12-L1 through L3-4. Conus medullaris and cauda equina: Conus extends to the L1 level. Conus and cauda equina appear normal. Paraspinal and other soft tissues: Limited imaging the abdomen is unremarkable. There is no significant adenopathy. Disc levels: L1-2: Negative. L2-3: Negative. L3-4: Negative. L4-5: A right paramedian disc protrusion is present. This leads to mild subarticular narrowing bilaterally. The foramina are patent. L5-S1: A large right paramedian disc protrusion is present. Severe right and moderate left subarticular narrowing is present. This likely impacts the S1 nerve roots bilaterally. Mild right foraminal narrowing is present. IMPRESSION: 1. Large right paramedian disc protrusion at L5-S1 with severe right and moderate left subarticular stenosis. This likely impacts the S1 nerve roots bilaterally, right greater than left. 2. More mild right paramedian disc protrusion at L4-5 and  right foraminal narrowing at L5-S1 potentially impacts the right L5 nerve roots as well. Electronically Signed   By: Marin Roberts M.D.   On: 04/28/2018 15:40   I personally (independently) visualized and performed the interpretation of the images attached in this note.    Assessment and Plan: 27 y.o. female with  Acutely worsened low back pain with lumbosacral radiculopathy worse in the right.  Patient has significant stenosis due to disc protrusion at L5-S1 impinging on the right S1 nerve root.  She also has potential impingement on the right L5 nerve root as well an MRI. She is failing conservative management and has some mild weakness to the foot dorsiflexion on the right and is having falls.  I am concerned that she is worsening and will be at risk for worsening weakness.  Plan to proceed with lumbar epidural steroid injection.  I am concerned given her severity of pain findings on MRI and new mild weakness enough that I think is reasonable to simultaneously get the ball rolling on a neurosurgery referral.  Plan to refer to neurosurgery and schedule an appointment after her lumbar epidural steroid injection scheduled for September 16.  In the interim will try to manage pain with max dose of gabapentin.  Additionally will use limited Norco.  If patient needs further developing opiates for pain control will eventually proceed with referral to pain management however her current issues are more acute and I am hopeful that she will have symptom improvement with epidural steroid injections.  Recheck in the near future.    Transportation resources also provided.  I spent 40 minutes with this patient, greater than 50% was face-to-face time counseling regarding ddx and plan and MRI findings.  Patient researched [facility] Controlled Substance Reporting System.   CC: Arliss Journey, PA-C  [address] [place], [place] [address]  Phone: [phone]  Fax: [phone]   Orders Placed This Encounter  Procedures  . Ambulatory referral to Neurosurgery    Referral Priority:   Routine     Referral Type:   Surgical    Referral Reason:   Specialty Services Required    Requested Specialty:   Neurosurgery    Number of Visits Requested:   1   Meds ordered this encounter  Medications  . DISCONTD: HYDROcodone-acetaminophen (NORCO/VICODIN) 5-325 MG tablet    Sig: Take 1 tablet by mouth every 6 (six) hours as needed.    Dispense:  15 tablet    Refill:  0  . HYDROcodone-acetaminophen (NORCO/VICODIN) 5-325 MG tablet    Sig: Take 1 tablet by mouth every 6 (six) hours as needed.    Dispense:  15 tablet    Refill:  0  . gabapentin (NEURONTIN) 300 MG capsule    Sig: Take 3 capsules (900 mg total) by mouth 3 (three) times daily.    Dispense:  810 capsule    Refill:  1    Historical information moved to improve visibility of documentation.  Past Medical History:  Diagnosis Date  . Acute thrombosis of right cephalic vein   . ADHD   . ADHD (attention deficit hyperactivity disorder)   . Asthma   . Autism   . Depression   . Esophagitis   . GERD (gastroesophageal reflux disease)   . Thyroid disease   . Ulcerative colitis [facility])    Past Surgical History:  Procedure Laterality Date  . KNEE ARTHROSCOPY     Social History   Tobacco Use  . Smoking  status: Never Smoker  . Smokeless tobacco: Never Used  Substance Use Topics  . Alcohol use: Yes    Comment: wine ocassionally   family history includes Diabetes in her father and mother; Hyperlipidemia in her mother; Thyroid disease in her mother.  Medications: Current Outpatient Medications  Medication Sig Dispense Refill  . acetaminophen (TYLENOL) 500 MG tablet Take 500 mg by mouth every 6 (six) hours as needed.    Marland Kitchen albuterol (PROVENTIL HFA;VENTOLIN HFA) 108 (90 Base) MCG/ACT inhaler Inhale 1-2 puffs into the lungs every 6 (six) hours as needed for wheezing or shortness of breath. 1 Inhaler 0  . baclofen (LIORESAL) 10 MG tablet Take 10 mg by mouth 3 (three) times daily.    . Botulinum Toxin Type A (BOTOX) 200 units SOLR Inject  as directed.    . budesonide-formoterol (SYMBICORT) 160-4.5 MCG/ACT inhaler Inhale 2 puffs into the lungs 2 (two) times daily.    . celecoxib (CELEBREX) 200 MG capsule Take 200 mg by mouth 2 (two) times daily.    . cetirizine (ZYRTEC) 10 MG tablet Take 1 tablet (10 mg total) by mouth daily. 30 tablet 0  . colestipol (COLESTID) 1 g tablet Take 1 g by mouth 2 (two) times daily.    Marland Kitchen dexlansoprazole (DEXILANT) 60 MG capsule Take 60 mg by mouth daily.    Marland Kitchen dicyclomine (BENTYL) 10 MG capsule Take 10 mg by mouth 4 (four) times daily -  before meals and at bedtime.    . docusate sodium (COLACE) 100 MG capsule Take 100 mg by mouth 2 (two) times daily.    Marland Kitchen EPINEPHrine 0.3 mg/0.3 mL IJ SOAJ injection Inject into the muscle once.    . gabapentin (NEURONTIN) 300 MG capsule Take 3 capsules (900 mg total) by mouth 3 (three) times daily. 810 capsule 1  . ibuprofen (ADVIL,MOTRIN) 800 MG tablet Take 1 tablet (800 mg total) by mouth 3 (three) times daily. 21 tablet 0  . levothyroxine (SYNTHROID, LEVOTHROID) 88 MCG tablet Take 88 mcg by mouth daily before breakfast.    . lidocaine (LIDODERM) 5 % Place 1 patch onto the skin daily. Remove & Discard patch within 12 hours or as directed by MD 30 patch 0  . mesalamine (APRISO) 0.375 G 24 hr capsule Take 375 mg by mouth 4 (four) times daily.    . metoCLOPramide (REGLAN) 10 MG tablet Take 1 tablet (10 mg total) by mouth every 8 (eight) hours as needed (for nausea). 10 tablet 0  . ondansetron (ZOFRAN) 8 MG tablet Take every 8 hours as needed and also prior to epidural steroid injection to avoid nausea 20 tablet 0  . ondansetron (ZOFRAN-ODT) 4 MG disintegrating tablet Take 4 mg by mouth every 8 (eight) hours as needed for nausea.    . promethazine (PHENERGAN) 25 MG tablet Take 25 mg by mouth every 6 (six) hours as needed for nausea.    . sertraline (ZOLOFT) 50 MG tablet Take 50 mg by mouth daily.    . sucralfate (CARAFATE) 1 G tablet Take 1 g by mouth 4 (four) times daily.     Marland Kitchen topiramate (TOPAMAX) 25 MG tablet Take 25 mg by mouth 2 (two) times daily. 3 at night    . triamcinolone cream (KENALOG) 0.1 % Apply 1 application topically 2 (two) times daily.    Marland Kitchen HYDROcodone-acetaminophen (NORCO/VICODIN) 5-325 MG tablet Take 1 tablet by mouth every 6 (six) hours as needed. 15 tablet 0   No current facility-administered medications for this visit.  Allergies  Allergen Reactions  . Bee Venom   . Eggs Or Egg-Derived Products     Egg whites  . Other     Pretzel, Epidural steroid injection       Discussed warning signs or symptoms. Please see discharge instructions. Patient expresses understanding.

## 2018-05-04 ENCOUNTER — Encounter: Payer: Self-pay | Admitting: Family Medicine

## 2018-05-04 DIAGNOSIS — R29898 Other symptoms and signs involving the musculoskeletal system: Secondary | ICD-10-CM

## 2018-05-04 DIAGNOSIS — M5441 Lumbago with sciatica, right side: Secondary | ICD-10-CM

## 2018-05-04 DIAGNOSIS — M5416 Radiculopathy, lumbar region: Secondary | ICD-10-CM

## 2018-05-04 NOTE — Telephone Encounter (Signed)
Called and spoke with Pt and her mother. Advised as to why she should not take more Rx than what is prescribed. Per Provider, OK to place Pain Management referral. They would prefer to stay in the [facility] location. Pt agreed to only take Rx as prescribed, but does ask if the Hydro/Ace Rx can be increased.   Routing to treating Provider.

## 2018-05-05 NOTE — Telephone Encounter (Signed)
Please contact patient back and ask about medication use.  I am concerned about the potential for accidental acetaminophen overdose.  Please schedule patient with me ASAP if total dose of a seated medicine is within safe limits if not advised patient go to the emergency room.  Offer appointment ASAP if needed.

## 2018-05-05 NOTE — Telephone Encounter (Signed)
I spoke with Pt and her mother regarding this yesterday - see separate MyChart message note. No extra Rx was taken.

## 2018-05-07 ENCOUNTER — Encounter: Payer: Self-pay | Admitting: Family Medicine

## 2018-05-07 NOTE — Telephone Encounter (Signed)
Left message for a return call

## 2018-05-11 ENCOUNTER — Encounter: Payer: Self-pay | Admitting: Family Medicine

## 2018-05-11 ENCOUNTER — Ambulatory Visit
Admission: RE | Admit: 2018-05-11 | Discharge: 2018-05-11 | Disposition: A | Discharge status: Home / Self Care | Payer: Medicare HMO | Source: Ambulatory Visit | Attending: Family Medicine | Admitting: Family Medicine

## 2018-05-11 MED ORDER — METHYLPREDNISOLONE ACETATE 40 MG/ML INJ SUSP (RADIOLOG
120.0000 mg | Freq: Once | INTRAMUSCULAR | Status: AC
  Administered 2018-05-11: 120 mg via EPIDURAL

## 2018-05-11 MED ORDER — IOPAMIDOL (ISOVUE-M 200) INJECTION 41%
1.0000 mL | Freq: Once | INTRAMUSCULAR | Status: AC
  Administered 2018-05-11: 1 mL via EPIDURAL

## 2018-05-11 NOTE — Discharge Instructions (Signed)

## 2018-05-15 ENCOUNTER — Encounter: Payer: Self-pay | Admitting: Family Medicine

## 2018-05-16 ENCOUNTER — Encounter: Payer: Self-pay | Admitting: Family Medicine

## 2018-05-22 ENCOUNTER — Other Ambulatory Visit: Payer: Self-pay | Admitting: Neurosurgery

## 2018-05-27 NOTE — Progress Notes (Signed)
Several unsuccessful attempts have been made to contact pt; lvm with pre-op instructions according to pre-op checklist. Please complete assessment on DOS. Pt made aware to stop taking  Aspirin, vitamins, fish oil and herbal medications. Do not take any NSAIDs ie: Ibuprofen, Advil, Naproxen (Aleve), Motrin, BC and Goody Powder.

## 2018-05-28 ENCOUNTER — Encounter (HOSPITAL_COMMUNITY): Payer: Self-pay

## 2018-05-28 ENCOUNTER — Ambulatory Visit (HOSPITAL_COMMUNITY): Payer: Medicare HMO

## 2018-05-28 ENCOUNTER — Encounter (HOSPITAL_COMMUNITY)
Admission: RE | Disposition: A | Discharge status: Home / Self Care | Payer: Self-pay | Source: Ambulatory Visit | Attending: Neurosurgery

## 2018-05-28 ENCOUNTER — Ambulatory Visit (HOSPITAL_COMMUNITY): Payer: Medicare HMO | Admitting: Certified Registered"

## 2018-05-28 ENCOUNTER — Ambulatory Visit (HOSPITAL_COMMUNITY)
Admission: RE | Admit: 2018-05-28 | Discharge: 2018-05-29 | Disposition: A | Discharge status: Home / Self Care | Payer: Medicare HMO | Source: Ambulatory Visit | Attending: Neurosurgery | Admitting: Neurosurgery

## 2018-05-28 ENCOUNTER — Other Ambulatory Visit: Payer: Self-pay

## 2018-05-28 DIAGNOSIS — J45909 Unspecified asthma, uncomplicated: Secondary | ICD-10-CM | POA: Insufficient documentation

## 2018-05-28 DIAGNOSIS — Z79890 Hormone replacement therapy (postmenopausal): Secondary | ICD-10-CM | POA: Insufficient documentation

## 2018-05-28 DIAGNOSIS — E039 Hypothyroidism, unspecified: Secondary | ICD-10-CM | POA: Diagnosis not present

## 2018-05-28 DIAGNOSIS — Z79891 Long term (current) use of opiate analgesic: Secondary | ICD-10-CM | POA: Insufficient documentation

## 2018-05-28 DIAGNOSIS — Z6841 Body Mass Index (BMI) 40.0 and over, adult: Secondary | ICD-10-CM | POA: Diagnosis not present

## 2018-05-28 DIAGNOSIS — Z8711 Personal history of peptic ulcer disease: Secondary | ICD-10-CM | POA: Diagnosis not present

## 2018-05-28 DIAGNOSIS — Z79899 Other long term (current) drug therapy: Secondary | ICD-10-CM | POA: Diagnosis not present

## 2018-05-28 DIAGNOSIS — M5126 Other intervertebral disc displacement, lumbar region: Secondary | ICD-10-CM | POA: Diagnosis present

## 2018-05-28 DIAGNOSIS — M1990 Unspecified osteoarthritis, unspecified site: Secondary | ICD-10-CM | POA: Diagnosis not present

## 2018-05-28 DIAGNOSIS — M5127 Other intervertebral disc displacement, lumbosacral region: Secondary | ICD-10-CM | POA: Insufficient documentation

## 2018-05-28 DIAGNOSIS — F329 Major depressive disorder, single episode, unspecified: Secondary | ICD-10-CM | POA: Insufficient documentation

## 2018-05-28 DIAGNOSIS — G709 Myoneural disorder, unspecified: Secondary | ICD-10-CM | POA: Diagnosis not present

## 2018-05-28 DIAGNOSIS — M5418 Radiculopathy, sacral and sacrococcygeal region: Secondary | ICD-10-CM | POA: Insufficient documentation

## 2018-05-28 DIAGNOSIS — F840 Autistic disorder: Secondary | ICD-10-CM | POA: Insufficient documentation

## 2018-05-28 DIAGNOSIS — F419 Anxiety disorder, unspecified: Secondary | ICD-10-CM | POA: Insufficient documentation

## 2018-05-28 DIAGNOSIS — K219 Gastro-esophageal reflux disease without esophagitis: Secondary | ICD-10-CM | POA: Insufficient documentation

## 2018-05-28 DIAGNOSIS — Z419 Encounter for procedure for purposes other than remedying health state, unspecified: Secondary | ICD-10-CM

## 2018-05-28 HISTORY — PX: LUMBAR LAMINECTOMY/DECOMPRESSION MICRODISCECTOMY: SHX5026

## 2018-05-28 LAB — CBC
HEMATOCRIT: 43.5 % (ref 36.0–46.0)
Hemoglobin: 12.7 g/dL (ref 12.0–15.0)
MCH: 25.8 pg — ABNORMAL LOW (ref 26.0–34.0)
MCHC: 29.2 g/dL — AB (ref 30.0–36.0)
MCV: 88.2 fL (ref 78.0–100.0)
PLATELETS: 241 10*3/uL (ref 150–400)
RBC: 4.93 MIL/uL (ref 3.87–5.11)
RDW: 13.4 % (ref 11.5–15.5)
WBC: 8 10*3/uL (ref 4.0–10.5)

## 2018-05-28 LAB — HCG, SERUM, QUALITATIVE: Preg, Serum: NEGATIVE

## 2018-05-28 SURGERY — LUMBAR LAMINECTOMY/DECOMPRESSION MICRODISCECTOMY 1 LEVEL
Anesthesia: General | Site: Spine Lumbar | Laterality: Right

## 2018-05-28 MED ORDER — ARIPIPRAZOLE 5 MG PO TABS
5.0000 mg | ORAL_TABLET | Freq: Every day | ORAL | Status: DC
  Administered 2018-05-29: 5 mg via ORAL
  Filled 2018-05-28: qty 1

## 2018-05-28 MED ORDER — CEFAZOLIN SODIUM-DEXTROSE 1-4 GM/50ML-% IV SOLN
INTRAVENOUS | Status: DC | PRN
  Administered 2018-05-28: 1 g via INTRAVENOUS

## 2018-05-28 MED ORDER — ACETAMINOPHEN 650 MG RE SUPP: 650.0000 mg | RECTAL | Status: DC | PRN

## 2018-05-28 MED ORDER — KETOROLAC TROMETHAMINE 0.5 % OP SOLN
1.0000 [drp] | Freq: Three times a day (TID) | OPHTHALMIC | Status: DC | PRN
  Filled 2018-05-28: qty 5

## 2018-05-28 MED ORDER — PROPOFOL 10 MG/ML IV BOLUS
INTRAVENOUS | Status: DC | PRN
  Administered 2018-05-28: 200 mg via INTRAVENOUS

## 2018-05-28 MED ORDER — HEMOSTATIC AGENTS (NO CHARGE) OPTIME
TOPICAL | Status: DC | PRN
  Administered 2018-05-28: 1 via TOPICAL

## 2018-05-28 MED ORDER — OXYCODONE HCL 5 MG PO TABS
5.0000 mg | ORAL_TABLET | ORAL | Status: DC | PRN
  Administered 2018-05-28: 5 mg via ORAL

## 2018-05-28 MED ORDER — OXYCODONE HCL 5 MG PO TABS
10.0000 mg | ORAL_TABLET | ORAL | Status: DC | PRN
  Filled 2018-05-28: qty 2

## 2018-05-28 MED ORDER — LEVOTHYROXINE SODIUM 100 MCG PO TABS
100.0000 ug | ORAL_TABLET | Freq: Every day | ORAL | Status: DC
  Administered 2018-05-29: 100 ug via ORAL
  Filled 2018-05-28: qty 1

## 2018-05-28 MED ORDER — DICYCLOMINE HCL 10 MG PO CAPS: 10.0000 mg | ORAL_CAPSULE | Freq: Four times a day (QID) | ORAL | Status: DC | PRN

## 2018-05-28 MED ORDER — ONDANSETRON 4 MG PO TBDP
4.0000 mg | ORAL_TABLET | Freq: Three times a day (TID) | ORAL | Status: DC | PRN
  Filled 2018-05-28: qty 1

## 2018-05-28 MED ORDER — SODIUM CHLORIDE 0.9% FLUSH: 3.0000 mL | INTRAVENOUS | Status: DC | PRN

## 2018-05-28 MED ORDER — MIDAZOLAM HCL 5 MG/5ML IJ SOLN
INTRAMUSCULAR | Status: DC | PRN
  Administered 2018-05-28: 2 mg via INTRAVENOUS

## 2018-05-28 MED ORDER — PANTOPRAZOLE SODIUM 40 MG PO TBEC
40.0000 mg | DELAYED_RELEASE_TABLET | Freq: Two times a day (BID) | ORAL | Status: DC
  Administered 2018-05-28 – 2018-05-29 (×2): 40 mg via ORAL
  Filled 2018-05-28 (×2): qty 1

## 2018-05-28 MED ORDER — SERTRALINE HCL 50 MG PO TABS
50.0000 mg | ORAL_TABLET | Freq: Every day | ORAL | Status: DC
  Administered 2018-05-29: 50 mg via ORAL
  Filled 2018-05-28: qty 1

## 2018-05-28 MED ORDER — FENTANYL CITRATE (PF) 100 MCG/2ML IJ SOLN
INTRAMUSCULAR | Status: DC | PRN
  Administered 2018-05-28: 100 ug via INTRAVENOUS

## 2018-05-28 MED ORDER — BACLOFEN 10 MG PO TABS
10.0000 mg | ORAL_TABLET | Freq: Three times a day (TID) | ORAL | Status: DC | PRN
  Filled 2018-05-28: qty 1

## 2018-05-28 MED ORDER — SUFENTANIL CITRATE 50 MCG/ML IV SOLN
INTRAVENOUS | Status: DC | PRN
  Administered 2018-05-28: 15 ug via INTRAVENOUS
  Administered 2018-05-28: 5 ug via INTRAVENOUS

## 2018-05-28 MED ORDER — NORGESTIMATE-ETH ESTRADIOL 0.25-35 MG-MCG PO TABS: 1.0000 | ORAL_TABLET | Freq: Every day | ORAL | Status: DC

## 2018-05-28 MED ORDER — DEXAMETHASONE SODIUM PHOSPHATE 10 MG/ML IJ SOLN
INTRAMUSCULAR | Status: AC
  Filled 2018-05-28: qty 1

## 2018-05-28 MED ORDER — CEFAZOLIN SODIUM-DEXTROSE 2-4 GM/100ML-% IV SOLN
INTRAVENOUS | Status: AC
  Filled 2018-05-28: qty 100

## 2018-05-28 MED ORDER — MOMETASONE FURO-FORMOTEROL FUM 200-5 MCG/ACT IN AERO
2.0000 | INHALATION_SPRAY | Freq: Two times a day (BID) | RESPIRATORY_TRACT | Status: DC
  Administered 2018-05-28: 2 via RESPIRATORY_TRACT
  Filled 2018-05-28: qty 8.8

## 2018-05-28 MED ORDER — FENTANYL CITRATE (PF) 100 MCG/2ML IJ SOLN: 25.0000 ug | INTRAMUSCULAR | Status: DC | PRN

## 2018-05-28 MED ORDER — DEXAMETHASONE SODIUM PHOSPHATE 10 MG/ML IJ SOLN
INTRAMUSCULAR | Status: DC | PRN
  Administered 2018-05-28: 5 mg via INTRAVENOUS

## 2018-05-28 MED ORDER — TOPIRAMATE 100 MG PO TABS
100.0000 mg | ORAL_TABLET | Freq: Every day | ORAL | Status: DC
  Administered 2018-05-28: 100 mg via ORAL
  Filled 2018-05-28 (×2): qty 1

## 2018-05-28 MED ORDER — SODIUM CHLORIDE 0.9 % IV SOLN: 250.0000 mL | INTRAVENOUS | Status: DC

## 2018-05-28 MED ORDER — ONDANSETRON HCL 4 MG/2ML IJ SOLN: 4.0000 mg | Freq: Once | INTRAMUSCULAR | Status: DC | PRN

## 2018-05-28 MED ORDER — MIDAZOLAM HCL 2 MG/2ML IJ SOLN
INTRAMUSCULAR | Status: AC
  Filled 2018-05-28: qty 2

## 2018-05-28 MED ORDER — PHENOL 1.4 % MT LIQD: 1.0000 | OROMUCOSAL | Status: DC | PRN

## 2018-05-28 MED ORDER — ACETAMINOPHEN 325 MG PO TABS: 650.0000 mg | ORAL_TABLET | ORAL | Status: DC | PRN

## 2018-05-28 MED ORDER — THROMBIN 5000 UNITS EX SOLR
CUTANEOUS | Status: DC | PRN
  Administered 2018-05-28 (×2): 5000 [IU] via TOPICAL

## 2018-05-28 MED ORDER — HYDROCODONE-ACETAMINOPHEN 7.5-325 MG PO TABS
1.0000 | ORAL_TABLET | Freq: Four times a day (QID) | ORAL | Status: DC
  Administered 2018-05-28 – 2018-05-29 (×4): 1 via ORAL
  Filled 2018-05-28 (×5): qty 1

## 2018-05-28 MED ORDER — THROMBIN 5000 UNITS EX SOLR
CUTANEOUS | Status: AC
  Filled 2018-05-28: qty 10000

## 2018-05-28 MED ORDER — GABAPENTIN 300 MG PO CAPS
900.0000 mg | ORAL_CAPSULE | Freq: Three times a day (TID) | ORAL | Status: DC
  Administered 2018-05-28 – 2018-05-29 (×3): 900 mg via ORAL
  Filled 2018-05-28 (×3): qty 3

## 2018-05-28 MED ORDER — POLYMYXIN B-TRIMETHOPRIM 10000-0.1 UNIT/ML-% OP SOLN
1.0000 [drp] | Freq: Three times a day (TID) | OPHTHALMIC | Status: DC
  Filled 2018-05-28: qty 10

## 2018-05-28 MED ORDER — LIDOCAINE 2% (20 MG/ML) 5 ML SYRINGE
INTRAMUSCULAR | Status: DC | PRN
  Administered 2018-05-28: 80 mg via INTRAVENOUS

## 2018-05-28 MED ORDER — SUFENTANIL CITRATE 50 MCG/ML IV SOLN
INTRAVENOUS | Status: AC
  Filled 2018-05-28: qty 1

## 2018-05-28 MED ORDER — EPINEPHRINE 0.3 MG/0.3ML IJ SOAJ
0.3000 mg | Freq: Once | INTRAMUSCULAR | Status: DC | PRN
  Filled 2018-05-28: qty 0.3

## 2018-05-28 MED ORDER — OXYCODONE HCL 5 MG PO TABS
ORAL_TABLET | ORAL | Status: AC
  Filled 2018-05-28: qty 1

## 2018-05-28 MED ORDER — SUGAMMADEX SODIUM 200 MG/2ML IV SOLN
INTRAVENOUS | Status: DC | PRN
  Administered 2018-05-28: 200 mg via INTRAVENOUS

## 2018-05-28 MED ORDER — ONDANSETRON HCL 4 MG PO TABS: 4.0000 mg | ORAL_TABLET | Freq: Four times a day (QID) | ORAL | Status: DC | PRN

## 2018-05-28 MED ORDER — LACTATED RINGERS IV SOLN
INTRAVENOUS | Status: DC
  Administered 2018-05-28 (×2): via INTRAVENOUS

## 2018-05-28 MED ORDER — LIDOCAINE 2% (20 MG/ML) 5 ML SYRINGE
INTRAMUSCULAR | Status: AC
  Filled 2018-05-28: qty 5

## 2018-05-28 MED ORDER — ONDANSETRON HCL 4 MG/2ML IJ SOLN: 4.0000 mg | Freq: Four times a day (QID) | INTRAMUSCULAR | Status: DC | PRN

## 2018-05-28 MED ORDER — METHYLPREDNISOLONE ACETATE 80 MG/ML IJ SUSP
INTRAMUSCULAR | Status: DC | PRN
  Administered 2018-05-28: 80 mg

## 2018-05-28 MED ORDER — POTASSIUM CHLORIDE IN NACL 20-0.9 MEQ/L-% IV SOLN: INTRAVENOUS | Status: DC

## 2018-05-28 MED ORDER — ZOLPIDEM TARTRATE 5 MG PO TABS: 5.0000 mg | ORAL_TABLET | Freq: Every evening | ORAL | Status: DC | PRN

## 2018-05-28 MED ORDER — FENTANYL CITRATE (PF) 100 MCG/2ML IJ SOLN
INTRAMUSCULAR | Status: AC
  Filled 2018-05-28: qty 2

## 2018-05-28 MED ORDER — LIDOCAINE-EPINEPHRINE 0.5 %-1:200000 IJ SOLN
INTRAMUSCULAR | Status: AC
  Filled 2018-05-28: qty 1

## 2018-05-28 MED ORDER — BSS IO SOLN
15.0000 mL | Freq: Once | INTRAOCULAR | Status: AC
  Administered 2018-05-28: 15 mL
  Filled 2018-05-28: qty 15

## 2018-05-28 MED ORDER — METHYLPREDNISOLONE ACETATE 80 MG/ML IJ SUSP
INTRAMUSCULAR | Status: AC
  Filled 2018-05-28: qty 1

## 2018-05-28 MED ORDER — CEFAZOLIN SODIUM-DEXTROSE 2-3 GM-%(50ML) IV SOLR
INTRAVENOUS | Status: DC | PRN
  Administered 2018-05-28: 2 g via INTRAVENOUS

## 2018-05-28 MED ORDER — PROPOFOL 10 MG/ML IV BOLUS
INTRAVENOUS | Status: AC
  Filled 2018-05-28: qty 20

## 2018-05-28 MED ORDER — SODIUM CHLORIDE 0.9% FLUSH
3.0000 mL | Freq: Two times a day (BID) | INTRAVENOUS | Status: DC
  Administered 2018-05-29: 3 mL via INTRAVENOUS

## 2018-05-28 MED ORDER — ONDANSETRON HCL 4 MG/2ML IJ SOLN
INTRAMUSCULAR | Status: DC | PRN
  Administered 2018-05-28: 4 mg via INTRAVENOUS

## 2018-05-28 MED ORDER — MOMETASONE FURO-FORMOTEROL FUM 100-5 MCG/ACT IN AERO
2.0000 | INHALATION_SPRAY | Freq: Two times a day (BID) | RESPIRATORY_TRACT | Status: DC
  Administered 2018-05-29 (×2): 2 via RESPIRATORY_TRACT
  Filled 2018-05-28: qty 8.8

## 2018-05-28 MED ORDER — ONDANSETRON HCL 4 MG/2ML IJ SOLN
INTRAMUSCULAR | Status: AC
  Filled 2018-05-28: qty 2

## 2018-05-28 MED ORDER — LIDOCAINE-EPINEPHRINE 0.5 %-1:200000 IJ SOLN
INTRAMUSCULAR | Status: DC | PRN
  Administered 2018-05-28: 10 mL

## 2018-05-28 MED ORDER — KETOROLAC TROMETHAMINE 15 MG/ML IJ SOLN
15.0000 mg | Freq: Four times a day (QID) | INTRAMUSCULAR | Status: AC
  Administered 2018-05-28 – 2018-05-29 (×3): 15 mg via INTRAVENOUS
  Filled 2018-05-28 (×4): qty 1

## 2018-05-28 MED ORDER — ROCURONIUM BROMIDE 50 MG/5ML IV SOSY
PREFILLED_SYRINGE | INTRAVENOUS | Status: AC
  Filled 2018-05-28: qty 5

## 2018-05-28 MED ORDER — MESALAMINE ER 0.375 G PO CP24: 0.3750 g | ORAL_CAPSULE | Freq: Four times a day (QID) | ORAL | Status: DC

## 2018-05-28 MED ORDER — PROMETHAZINE HCL 25 MG PO TABS: 25.0000 mg | ORAL_TABLET | Freq: Four times a day (QID) | ORAL | Status: DC | PRN

## 2018-05-28 MED ORDER — MENTHOL 3 MG MT LOZG: 1.0000 | LOZENGE | OROMUCOSAL | Status: DC | PRN

## 2018-05-28 MED ORDER — 0.9 % SODIUM CHLORIDE (POUR BTL) OPTIME
TOPICAL | Status: DC | PRN
  Administered 2018-05-28: 1000 mL

## 2018-05-28 MED ORDER — CEFAZOLIN SODIUM-DEXTROSE 1-4 GM/50ML-% IV SOLN
INTRAVENOUS | Status: AC
  Filled 2018-05-28: qty 50

## 2018-05-28 MED ORDER — METOCLOPRAMIDE HCL 10 MG PO TABS: 10.0000 mg | ORAL_TABLET | Freq: Three times a day (TID) | ORAL | Status: DC | PRN

## 2018-05-28 MED ORDER — ROCURONIUM BROMIDE 10 MG/ML (PF) SYRINGE
PREFILLED_SYRINGE | INTRAVENOUS | Status: DC | PRN
  Administered 2018-05-28: 50 mg via INTRAVENOUS

## 2018-05-28 MED ORDER — ALBUTEROL SULFATE (2.5 MG/3ML) 0.083% IN NEBU: 3.0000 mL | INHALATION_SOLUTION | Freq: Four times a day (QID) | RESPIRATORY_TRACT | Status: DC | PRN

## 2018-05-28 MED ORDER — EPINEPHRINE 0.3 MG/0.3ML IJ SOAJ: 0.3000 mg | Freq: Once | INTRAMUSCULAR | Status: DC

## 2018-05-28 SURGICAL SUPPLY — 58 items
BENZOIN TINCTURE PRP APPL 2/3 (GAUZE/BANDAGES/DRESSINGS) IMPLANT
BLADE CLIPPER SURG (BLADE) IMPLANT
BUR MATCHSTICK NEURO 3.0 LAGG (BURR) ×3 IMPLANT
BUR PRECISION FLUTE 5.0 (BURR) IMPLANT
CANISTER SUCT 3000ML PPV (MISCELLANEOUS) ×3 IMPLANT
CARTRIDGE OIL MAESTRO DRILL (MISCELLANEOUS) ×1 IMPLANT
CLOSURE WOUND 1/2 X4 (GAUZE/BANDAGES/DRESSINGS)
COVER WAND RF STERILE (DRAPES) IMPLANT
DECANTER SPIKE VIAL GLASS SM (MISCELLANEOUS) ×3 IMPLANT
DERMABOND ADVANCED (GAUZE/BANDAGES/DRESSINGS) ×2
DERMABOND ADVANCED .7 DNX12 (GAUZE/BANDAGES/DRESSINGS) ×1 IMPLANT
DIFFUSER DRILL AIR PNEUMATIC (MISCELLANEOUS) ×3 IMPLANT
DRAPE LAPAROTOMY 100X72X124 (DRAPES) ×3 IMPLANT
DRAPE MICROSCOPE LEICA (MISCELLANEOUS) ×3 IMPLANT
DRAPE SURG 17X23 STRL (DRAPES) ×3 IMPLANT
DURAPREP 26ML APPLICATOR (WOUND CARE) ×3 IMPLANT
ELECT BLADE 4.0 EZ CLEAN MEGAD (MISCELLANEOUS) ×3
ELECT REM PT RETURN 9FT ADLT (ELECTROSURGICAL) ×3
ELECTRODE BLDE 4.0 EZ CLN MEGD (MISCELLANEOUS) ×1 IMPLANT
ELECTRODE REM PT RTRN 9FT ADLT (ELECTROSURGICAL) ×1 IMPLANT
GAUZE 4X4 16PLY RFD (DISPOSABLE) IMPLANT
GAUZE SPONGE 4X4 12PLY STRL (GAUZE/BANDAGES/DRESSINGS) IMPLANT
GLOVE BIO SURGEON STRL SZ7 (GLOVE) IMPLANT
GLOVE BIO SURGEON STRL SZ7.5 (GLOVE) ×3 IMPLANT
GLOVE BIOGEL PI IND STRL 6.5 (GLOVE) ×3 IMPLANT
GLOVE BIOGEL PI IND STRL 7.5 (GLOVE) ×2 IMPLANT
GLOVE BIOGEL PI INDICATOR 6.5 (GLOVE) ×6
GLOVE BIOGEL PI INDICATOR 7.5 (GLOVE) ×4
GLOVE ECLIPSE 6.5 STRL STRAW (GLOVE) ×3 IMPLANT
GLOVE SS N UNI LF 6.5 STRL (GLOVE) ×9 IMPLANT
GLOVE SURG SS PI 6.0 STRL IVOR (GLOVE) ×12 IMPLANT
GOWN STRL REUS W/ TWL LRG LVL3 (GOWN DISPOSABLE) ×4 IMPLANT
GOWN STRL REUS W/ TWL XL LVL3 (GOWN DISPOSABLE) IMPLANT
GOWN STRL REUS W/TWL 2XL LVL3 (GOWN DISPOSABLE) IMPLANT
GOWN STRL REUS W/TWL LRG LVL3 (GOWN DISPOSABLE) ×8
GOWN STRL REUS W/TWL XL LVL3 (GOWN DISPOSABLE)
KIT BASIN OR (CUSTOM PROCEDURE TRAY) ×3 IMPLANT
KIT TURNOVER KIT B (KITS) ×3 IMPLANT
NEEDLE HYPO 18GX1.5 BLUNT FILL (NEEDLE) ×3 IMPLANT
NEEDLE HYPO 25X1 1.5 SAFETY (NEEDLE) ×3 IMPLANT
NEEDLE SPNL 18GX3.5 QUINCKE PK (NEEDLE) IMPLANT
NS IRRIG 1000ML POUR BTL (IV SOLUTION) ×3 IMPLANT
OIL CARTRIDGE MAESTRO DRILL (MISCELLANEOUS) ×3
PACK LAMINECTOMY NEURO (CUSTOM PROCEDURE TRAY) ×3 IMPLANT
PAD ARMBOARD 7.5X6 YLW CONV (MISCELLANEOUS) ×15 IMPLANT
RUBBERBAND STERILE (MISCELLANEOUS) ×6 IMPLANT
SLEEVE SURGICAL STRL (SLEEVE) ×3 IMPLANT
SPONGE LAP 4X18 RFD (DISPOSABLE) IMPLANT
SPONGE SURGIFOAM ABS GEL SZ50 (HEMOSTASIS) ×3 IMPLANT
STRIP CLOSURE SKIN 1/2X4 (GAUZE/BANDAGES/DRESSINGS) IMPLANT
SUT VIC AB 0 CT1 18XCR BRD8 (SUTURE) ×1 IMPLANT
SUT VIC AB 0 CT1 8-18 (SUTURE) ×2
SUT VIC AB 2-0 CT1 18 (SUTURE) ×3 IMPLANT
SUT VIC AB 3-0 SH 8-18 (SUTURE) ×6 IMPLANT
SYR 3ML LL SCALE MARK (SYRINGE) ×3 IMPLANT
TOWEL GREEN STERILE (TOWEL DISPOSABLE) ×3 IMPLANT
TOWEL GREEN STERILE FF (TOWEL DISPOSABLE) ×3 IMPLANT
WATER STERILE IRR 1000ML POUR (IV SOLUTION) ×3 IMPLANT

## 2018-05-28 NOTE — Op Note (Signed)
05/28/2018  4:00 PM  PATIENT:  Jasmine Hickman  27 y.o. 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 was taken to the operating room, intubated and placed under a general anesthetic without difficulty. She was positioned prone on a Wilson frame with all pressure points padded. Her back was prepped and draped in a sterile manner. I opened the skin with a 10 blade and carried the dissection down to the thoracolumbar fascia. I used both sharp dissection and the monopolar cautery to expose the lamina of L5, and S1. I confirmed my location with an intraoperative xray.  I used the drill, Kerrison punches, and curettes to perform a semihemilaminectomy of L5. I used the punches to remove the ligamentum flavum to expose the thecal sac. I brought the microscope into the operative field and with Dr.Ostergard's assistance we started our decompression of the spinal canal, thecal sac and S1 root(s). I cauterized epidural veins overlying the disc space then divided them sharply. I opened the disc space with a 15 blade and proceeded with the discectomy. I used pituitary rongeurs, curettes, and other instruments to remove disc material. After the discectomy was completed we inspected the S1 nerve root and felt it was well decompressed. I explored rostrally, laterally, medially, and caudally and was satisfied with the decompression. I  irrigated the wound, bathed the operative site with depomedrol and fentanyl, then closed in layers. I approximated the thoracolumbar fascia, subcutaneous, and subcuticular planes with vicryl sutures. I used dermabond for a sterile dressing.   PLAN OF CARE: Admit for overnight observation  PATIENT DISPOSITION:  PACU - hemodynamically stable.   Delay start of Pharmacological VTE agent (>24hrs) due to surgical blood loss or risk of bleeding:  yes
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
## 2018-05-28 NOTE — Transfer of Care (Signed)
Immediate Anesthesia Transfer of Care Note  Patient: Jasmine Hickman  Procedure(s) Performed: Right Lumbar Five Sacral One Microdiscectomy (Right Spine Lumbar)  Patient Location: PACU  Anesthesia Type:General  Level of Consciousness: drowsy  Airway & Oxygen Therapy: Patient Spontanous Breathing and Patient connected to nasal cannula oxygen  Post-op Assessment: Report given to RN, Post -op Vital signs reviewed and stable and Patient moving all extremities  Post vital signs: Reviewed and stable  Last Vitals:  Vitals Value Taken Time  BP 104/85 05/28/2018  3:59 PM  Temp    Pulse 89 05/28/2018  3:59 PM  Resp 12 05/28/2018  3:59 PM  SpO2 98 % 05/28/2018  3:59 PM  Vitals shown include unvalidated device data.  Last Pain:  Vitals:   05/28/18 1231  TempSrc:   PainSc: 10-Worst pain ever      Patients Stated Pain Goal: 3 (05/28/18 1231)  Complications: No apparent anesthesia complications

## 2018-05-28 NOTE — Anesthesia Preprocedure Evaluation (Addendum)
Anesthesia Evaluation  Patient identified by MRN, date of birth, ID band Patient awake    Reviewed: Allergy & Precautions, NPO status , Patient's Chart, lab work & pertinent test results  Airway Mallampati: III  TM Distance: >3 FB Neck ROM: Full  Mouth opening: Limited Mouth Opening  Dental  (+) Teeth Intact, Dental Advisory Given   Pulmonary asthma ,    breath sounds clear to auscultation       Cardiovascular  Rhythm:Regular Rate:Normal     Neuro/Psych Autism  Neuromuscular disease    GI/Hepatic Neg liver ROS, PUD, GERD  Medicated and Controlled,  Endo/Other  Hypothyroidism Morbid obesity  Renal/GU negative Renal ROS     Musculoskeletal  (+) Arthritis ,   Abdominal   Peds  Hematology negative hematology ROS (+)   Anesthesia Other Findings   Reproductive/Obstetrics                            Lab Results  Component Value Date   WBC 8.0 05/28/2018   HGB 12.7 05/28/2018   HCT 43.5 05/28/2018   MCV 88.2 05/28/2018   PLT 241 05/28/2018    Anesthesia Physical Anesthesia Plan  ASA: III  Anesthesia Plan: General   Post-op Pain Management:    Induction: Intravenous  PONV Risk Score and Plan: 3 and Ondansetron, Dexamethasone and Treatment may vary due to age or medical condition  Airway Management Planned: Oral ETT  Additional Equipment: None  Intra-op Plan:   Post-operative Plan: Extubation in OR  Informed Consent: I have reviewed the patients History and Physical, chart, labs and discussed the procedure including the risks, benefits and alternatives for the proposed anesthesia with the patient or authorized representative who has indicated his/her understanding and acceptance.   Dental advisory given  Plan Discussed with: CRNA, Anesthesiologist and Surgeon  Anesthesia Plan Comments:        Anesthesia Quick Evaluation

## 2018-05-28 NOTE — Anesthesia Procedure Notes (Signed)
Procedure Name: Intubation Date/Time: 05/28/2018 2:14 PM Performed by: Moshe Salisbury, CRNA Pre-anesthesia Checklist: Patient identified, Emergency Drugs available, Suction available and Patient being monitored Patient Re-evaluated:Patient Re-evaluated prior to induction Oxygen Delivery Method: Circle System Utilized Preoxygenation: Pre-oxygenation with 100% oxygen Induction Type: IV induction Ventilation: Mask ventilation without difficulty Laryngoscope Size: Mac and 3 Grade View: Grade I Tube type: Oral Tube size: 7.5 mm Number of attempts: 1 Airway Equipment and Method: Stylet Placement Confirmation: ETT inserted through vocal cords under direct vision,  positive ETCO2 and breath sounds checked- equal and bilateral Secured at: 20 cm Tube secured with: Tape Dental Injury: Teeth and Oropharynx as per pre-operative assessment

## 2018-05-28 NOTE — Anesthesia Postprocedure Evaluation (Signed)
Anesthesia Post Note  Patient: Jasmine Hickman  Procedure(s) Performed: Right Lumbar Five Sacral One Microdiscectomy (Right Spine Lumbar)     Patient location during evaluation: PACU Anesthesia Type: General Level of consciousness: awake and alert Pain management: pain level controlled Vital Signs Assessment: post-procedure vital signs reviewed and stable Respiratory status: spontaneous breathing, nonlabored ventilation, respiratory function stable and patient connected to nasal cannula oxygen Cardiovascular status: blood pressure returned to baseline and stable Postop Assessment: no apparent nausea or vomiting Anesthetic complications: no    Last Vitals:  Vitals:   05/28/18 1715 05/28/18 1745  BP: 117/74 115/71  Pulse: 89 81  Resp: 18 14  Temp:    SpO2: 98% 100%    Last Pain:  Vitals:   05/28/18 1630  TempSrc:   PainSc: 3                  Kennieth Rad
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
## 2018-05-29 ENCOUNTER — Encounter (HOSPITAL_COMMUNITY): Payer: Self-pay | Admitting: Neurosurgery

## 2018-05-29 DIAGNOSIS — M5127 Other intervertebral disc displacement, lumbosacral region: Secondary | ICD-10-CM | POA: Diagnosis not present

## 2018-05-29 MED ORDER — HYDROCODONE-ACETAMINOPHEN 5-325 MG PO TABS: 1.0000 | ORAL_TABLET | Freq: Four times a day (QID) | ORAL | 0 refills | Status: DC | PRN

## 2018-05-29 MED ORDER — BACLOFEN 10 MG PO TABS: 10.0000 mg | ORAL_TABLET | Freq: Three times a day (TID) | ORAL | 2 refills | Status: DC | PRN
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
## 2018-05-29 NOTE — Discharge Instructions (Signed)
Lumbar Discectomy °Care After °A discectomy involves removal of discmaterial (the cartilage-like structures located between the bones of the back). It is done to relieve pressure on nerve roots. It can be used as a treatment for a back problem. The time in surgery depends on the findings in surgery and what is necessary to correct the problems. °HOME CARE INSTRUCTIONS  °· Check the cut (incision) made by the surgeon twice a day for signs of infection. Some signs of infection may include:  °· A foul smelling, greenish or yellowish discharge from the wound.  °· Increased pain.  °· Increased redness over the incision (operative) site.  °· The skin edges may separate.  °· Flu-like symptoms (problems).  °· A temperature above 101.5° F (38.6° C).  °· Change your bandages in about 24 to 36 hours following surgery or as directed.  °· You may shower tomrrow.  Avoid bathtubs, swimming pools and hot tubs for three weeks or until your incision has healed completely. °· Follow your doctor's instructions as to safe activities, exercises, and physical therapy.  °· Weight reduction may be beneficial if you are overweight.  °· Daily exercise is helpful to prevent the return of problems. Walking is permitted. You may use a treadmill without an incline. Cut down on activities and exercise if you have discomfort. You may also go up and down stairs as much as you can tolerate.  °· DO NOT lift anything heavier than 10 to 15 lbs. Avoid bending or twisting at the waist. Always bend your knees when lifting.  °· Maintain strength and range of motion as instructed.  °· Do not drive for 10 days, or as directed by your doctors. You may be a passenger . Lying back in the passenger seat may be more comfortable for you. Always wear a seatbelt.  °· Limit your sitting in a regular chair to 20 to 30 minutes at a time. There are no limitations for sitting in a recliner. You should lie down or walk in between sitting periods.  °· Only take  over-the-counter or prescription medicines for pain, discomfort, or fever as directed by your caregiver.  °SEEK MEDICAL CARE IF:  °· There is increased bleeding (more than a small spot) from the wound.  °· You notice redness, swelling, or increasing pain in the wound.  °· Pus is coming from wound.  °· You develop an unexplained oral temperature above 102° F (38.9° C) develops.  °· You notice a foul smell coming from the wound or dressing.  °· You have increasing pain in your wound.  °SEEK IMMEDIATE MEDICAL CARE IF:  °· You develop a rash.  °· You have difficulty breathing.  °· You develop any allergic problems to medicines given.  °Document Released: 07/17/2004 Document Revised: 08/01/2011 Document Reviewed: 11/05/2007 °ExitCare® Patient Information °

## 2018-05-29 NOTE — Discharge Summary (Signed)
Physician Discharge Summary  Patient ID: Jasmine Hickman MRN: [id number] DOB/AGE: 26-11-1990 26 y.o.  Admit date: 05/28/2018 Discharge date: 05/29/2018  Admission Diagnoses:  Lumbar HNP  Discharge Diagnoses:  Same Active Problems:   HNP (herniated nucleus pulposus), lumbar   Discharged Condition: Stable  Hospital Course:  Jasmine Hickman is a 27 y.o. female who was admitted for the below procedure. There were no post operative complications. At time of discharge, pain was well controlled, ambulating with Pt/OT, tolerating po, voiding normal. Ready for discharge.  Treatments: Surgery - R L5-S1 microdisc  Discharge Exam: Blood pressure 123/66, pulse 85, temperature 98.5 F (36.9 C), temperature source Oral, resp. rate 18, height 5\' 8"  (1.727 m), weight 122 kg, SpO2 98 %. Awake, alert, oriented Speech fluent, appropriate CN grossly intact 5/5 BUE/BLE Wound c/d/i  Disposition: Discharge disposition: 01-Home or Self Care       Discharge Instructions    Call MD for:  difficulty breathing, headache or visual disturbances   Complete by:  As directed    Call MD for:  persistant dizziness or light-headedness   Complete by:  As directed    Call MD for:  redness, tenderness, or signs of infection (pain, swelling, redness, odor or green/yellow discharge around incision site)   Complete by:  As directed    Call MD for:  severe uncontrolled pain   Complete by:  As directed    Call MD for:  temperature >100.4   Complete by:  As directed    Diet general   Complete by:  As directed    Driving Restrictions   Complete by:  As directed    Do not drive until given clearance.   Increase activity slowly   Complete by:  As directed    Lifting restrictions   Complete by:  As directed    Do not lift anything >10lbs. Avoid bending and twisting in awkward positions. Avoid bending at the back.   May shower / Bathe   Complete by:  As directed    In 24 hours. Okay to wash wound with warm  soapy water. Avoid scrubbing the wound. Pat dry.   Remove dressing in 24 hours   Complete by:  As directed      Allergies as of 05/29/2018      Reactions   Other Nausea And Vomiting, Swelling   PRETZELS > ANGIOEDEMA, SWELLING OF TONGUE Pt reports experienced n/v with epidural steroid injection - unknown name (allergies would not title as "unknown") Pretzel, Epidural steroid injection Pretzels causes tongue swelling Tongue swelling- Pretzels   Bee Pollen Swelling   Angioedema    Bee Venom    UNSPECIFIED REACTION    Eggs Or Egg-derived Products Other (See Comments)   Egg whites "makes the inside of my face burn"  [Can take flu shot]   Lac Bovis Other (See Comments)   "makes the inside of my face burn"       Medication List    TAKE these medications   albuterol 108 (90 Base) MCG/ACT inhaler Commonly known as:  PROVENTIL HFA;VENTOLIN HFA Inhale 1-2 puffs into the lungs every 6 (six) hours as needed for wheezing or shortness of breath.   ARIPiprazole 5 MG tablet Commonly known as:  ABILIFY Take 5 mg by mouth daily.   baclofen 10 MG tablet Commonly known as:  LIORESAL Take 1 tablet (10 mg total) by mouth 3 (three) times daily as needed for muscle spasms.   BOTOX 200 units Solr Generic drug:  Botulinum  Toxin Type A Inject as directed every 3 (three) months.   budesonide-formoterol 160-4.5 MCG/ACT inhaler Commonly known as:  SYMBICORT Inhale 2 puffs into the lungs 2 (two) times daily as needed (asthma).   dicyclomine 10 MG capsule Commonly known as:  BENTYL Take 10 mg by mouth 4 (four) times daily as needed for spasms.   EPINEPHrine 0.3 mg/0.3 mL Soaj injection Commonly known as:  EPI-PEN Inject into the muscle once.   gabapentin 300 MG capsule Commonly known as:  NEURONTIN Take 3 capsules (900 mg total) by mouth 3 (three) times daily.   HYDROcodone-acetaminophen 5-325 MG tablet Commonly known as:  NORCO/VICODIN Take 1 tablet by mouth every 6 (six) hours as needed  (pain).   levothyroxine 100 MCG tablet Commonly known as:  SYNTHROID, LEVOTHROID Take 100 mcg by mouth daily before breakfast.   mesalamine 0.375 g 24 hr capsule Commonly known as:  APRISO Take 375 mg by mouth 4 (four) times daily.   metoCLOPramide 10 MG tablet Commonly known as:  REGLAN Take 1 tablet (10 mg total) by mouth every 8 (eight) hours as needed (for nausea).   norgestimate-ethinyl estradiol 0.25-35 MG-MCG tablet Commonly known as:  ORTHO-CYCLEN,SPRINTEC,PREVIFEM Take 1 tablet by mouth daily.   ondansetron 4 MG disintegrating tablet Commonly known as:  ZOFRAN-ODT Take 4 mg by mouth every 8 (eight) hours as needed for nausea.   oxyCODONE-acetaminophen 10-325 MG tablet Commonly known as:  PERCOCET Take 1 tablet by mouth every 6 (six) hours as needed for pain.   pantoprazole 40 MG tablet Commonly known as:  PROTONIX Take 40 mg by mouth 2 (two) times daily.   promethazine 25 MG tablet Commonly known as:  PHENERGAN Take 25 mg by mouth every 6 (six) hours as needed for nausea.   sertraline 50 MG tablet Commonly known as:  ZOLOFT Take 50 mg by mouth daily.   topiramate 100 MG tablet Commonly known as:  TOPAMAX Take 100 mg by mouth at bedtime.      Follow-up Information    Coletta Memos, MD Follow up.   Specialty:  Neurosurgery Contact information: [address] [place] [address] [phone]           Signed: Alyson Ingles 05/29/2018, 4:24 PM

## 2018-05-29 NOTE — Progress Notes (Signed)
Pt was wheeled down the parking area accompanied by family members for discharge, in no acute distress, pain or discomfort. Discharge instructions given by day RN during shift change; prescriptions and discharge paper works handed to pt's mother.

## 2018-06-02 ENCOUNTER — Encounter: Payer: Self-pay | Admitting: Family Medicine

## 2018-06-02 NOTE — Progress Notes (Unsigned)
Received medical records from [facility].  Plan to proceed with surgical discectomy.  We will send documentation to scan.

## 2018-06-19 ENCOUNTER — Ambulatory Visit: Payer: Self-pay | Admitting: Family Medicine

## 2018-06-22 ENCOUNTER — Encounter: Payer: Self-pay | Admitting: Family Medicine

## 2018-06-22 ENCOUNTER — Ambulatory Visit (INDEPENDENT_AMBULATORY_CARE_PROVIDER_SITE_OTHER): Payer: Medicare HMO | Admitting: Family Medicine

## 2018-06-22 ENCOUNTER — Ambulatory Visit (INDEPENDENT_AMBULATORY_CARE_PROVIDER_SITE_OTHER): Payer: Medicare HMO

## 2018-06-22 VITALS — BP 118/79 | HR 81 | Wt 287.0 lb

## 2018-06-22 DIAGNOSIS — M25561 Pain in right knee: Secondary | ICD-10-CM

## 2018-06-22 MED ORDER — DICLOFENAC SODIUM 1 % TD GEL: 4.0000 g | Freq: Four times a day (QID) | TRANSDERMAL | 11 refills | Status: DC

## 2018-06-22 NOTE — Patient Instructions (Addendum)
Thank you for coming in today.  Recheck in 2 weeks.  Work on straight leg raises.  Apply the diclofenac gel up to 4 x daily. Do home health PT.

## 2018-06-22 NOTE — Progress Notes (Addendum)
Jasmine Hickman is a 27 y.o. female who presents, with her mother Jasmine Hickman), to [facility] today for right knee pain and popping. [name] reports that ~1 month ago, her right knee started popping with any movement of her knee, even when seated or laying down. The popping is accompanied by pain. She is still able to walk and continue her normal daily activities. She does not recall any inciting event. She has not tried any medications, ice, or heat. She denies swelling, warmth, and erythema. She reports 9/10 pain with palpation of the right knee.  She reports similar symptoms prior to her right arthroscopic knee surgery at [facility] in 2014.  Additionally she notes that she recently had back surgery for significant lumbar radiculopathy.  She had microdiscectomy about a month ago and feels much better.  She rates her pain as a 1 out of 10 now down from a 10 out of 10 where it was previously.  ROS:  As above  Exam:  BP 118/79   Pulse 81   Wt 287 lb (130.2 kg)   BMI 43.64 kg/m  General: Well Developed, well nourished, and in no acute distress.  Neuro/Psych: Alert and oriented x3, extra-ocular muscles intact, able to move all 4 extremities, sensation grossly intact. Skin: Warm and dry, no rashes noted.  Respiratory: Not using accessory muscles, speaking in full sentences, trachea midline.  Cardiovascular: Pulses palpable, no extremity edema. Abdomen: Does not appear distended. MSK:  Right knee: Normal appearing without effusions or erythema. Scars present from previous knee surgery. Tender to palpation over and around the patella. Range of motion 0-120 degrees with retropatellar crepitus. Stable ligamentous exam. Patient guards with McMurray's testing.  Left knee: Normal appearing without effusions or erythema. Not tender to palpation. Range of motion 0-120 degrees without crepitus. Stable ligamentous exam intact flexion and  extension strength.   Lab and Radiology Results X-ray right knee personally independently reviewed. Minimal degenerative changes.  No acute fracture.   X-ray left knee personally independent reviewed Minimal degenerative changes no acute fracture.  Await formal radiology review.   Assessment and Plan: 27 y.o. female with Right knee popping and pain: Jasmine Hickman most likely has patellofemoral pain syndrome, secondary to walking adaptations that she made with her history of back pain. Relatively normal knee x-ray. Start home health PT and straight leg raises. Prescribed diclofenac gel, up to 4x/day. Follow-up in 2 weeks to reassess. If not improved in the future, consider cortisone injection and/or MRI.    Addendum: Actually has significant transportation difficulties.  She does not drive herself.  She relies on others for transportation and as noted in previous documentation there is significant difficulty with access to cars and difficulty getting to appointments.  She will benefit from home health physical therapy versus outpatient physical therapy because of these transportation difficulties.   Orders Placed This Encounter  Procedures  . DG Knee Complete 4 Views Right    Please include patellar sunrise, lateral, and weightbearing bilateral AP and bilateral rosenberg views    Standing Status:   Future    Number of Occurrences:   1    Standing Expiration Date:   08/22/2019    Order Specific Question:   Reason for exam:    Answer:   Please include patellar sunrise, lateral, and weightbearing bilateral AP and bilateral rosenberg views    Comments:   Please include patellar sunrise, lateral, and weightbearing bilateral AP and bilateral rosenberg views    Order  Specific Question:   Preferred imaging location?    Answer:   [facility]  . DG Knee 1-2 Views Left    Standing Status:   Future    Number of Occurrences:   1    Standing Expiration Date:   08/23/2019    Order Specific  Question:   Reason for Exam (SYMPTOM  OR DIAGNOSIS REQUIRED)    Answer:   For use with right knee x-ray, bilateral AP and Rosenberg standing.    Order Specific Question:   Is the patient pregnant?    Answer:   No    Order Specific Question:   Preferred imaging location?    Answer:   [facility]  . Ambulatory referral to Home Health    Referral Priority:   Routine    Referral Type:   Home Health Care    Referral Reason:   Specialty Services Required    Requested Specialty:   Home Health Services    Number of Visits Requested:   1   Meds ordered this encounter  Medications  . diclofenac sodium (VOLTAREN) 1 % GEL    Sig: Apply 4 g topically 4 (four) times daily. To affected joint.    Dispense:  100 g    Refill:  11    Cannot take oral NSAIDs due to stomach ulcers. Knee OA    Historical information moved to improve visibility of documentation.  Past Medical History:  Diagnosis Date  . Acute thrombosis of right cephalic vein   . ADHD   . ADHD (attention deficit hyperactivity disorder)   . Asthma   . Autism   . Depression   . Esophagitis   . GERD (gastroesophageal reflux disease)   . Thyroid disease   . Ulcerative colitis [facility])    Past Surgical History:  Procedure Laterality Date  . KNEE ARTHROSCOPY    . LUMBAR LAMINECTOMY/DECOMPRESSION MICRODISCECTOMY Right 05/28/2018   Procedure: Right Lumbar Five Sacral One Microdiscectomy;  Surgeon: Coletta Memos, MD;  Location: [facility] OR;  Service: Neurosurgery;  Laterality: Right;  Right Lumbar Five Sacral One Microdiscectomy   Social History   Tobacco Use  . Smoking status: Never Smoker  . Smokeless tobacco: Never Used  Substance Use Topics  . Alcohol use: Yes    Comment: wine ocassionally   family history includes Diabetes in her father and mother; Hyperlipidemia in her mother; Thyroid disease in her mother.  Medications: Current Outpatient Medications  Medication Sig Dispense Refill  . albuterol (PROVENTIL HFA;VENTOLIN  HFA) 108 (90 Base) MCG/ACT inhaler Inhale 1-2 puffs into the lungs every 6 (six) hours as needed for wheezing or shortness of breath. 1 Inhaler 0  . ARIPiprazole (ABILIFY) 5 MG tablet Take 5 mg by mouth daily.    . baclofen (LIORESAL) 10 MG tablet Take 1 tablet (10 mg total) by mouth 3 (three) times daily as needed for muscle spasms. 90 each 2  . Botulinum Toxin Type A (BOTOX) 200 units SOLR Inject as directed every 3 (three) months.     . budesonide-formoterol (SYMBICORT) 160-4.5 MCG/ACT inhaler Inhale 2 puffs into the lungs 2 (two) times daily as needed (asthma).     . dicyclomine (BENTYL) 10 MG capsule Take 10 mg by mouth 4 (four) times daily as needed for spasms.     Marland Kitchen EPINEPHrine 0.3 mg/0.3 mL IJ SOAJ injection Inject into the muscle once.    . gabapentin (NEURONTIN) 300 MG capsule Take 3 capsules (900 mg total) by mouth 3 (three) times daily. 810  capsule 1  . HYDROcodone-acetaminophen (NORCO/VICODIN) 5-325 MG tablet Take 1 tablet by mouth every 6 (six) hours as needed (pain). 60 tablet 0  . levothyroxine (SYNTHROID, LEVOTHROID) 100 MCG tablet Take 100 mcg by mouth daily before breakfast.     . mesalamine (APRISO) 0.375 G 24 hr capsule Take 375 mg by mouth 4 (four) times daily.    . metoCLOPramide (REGLAN) 10 MG tablet Take 1 tablet (10 mg total) by mouth every 8 (eight) hours as needed (for nausea). 10 tablet 0  . norgestimate-ethinyl estradiol (ORTHO-CYCLEN,SPRINTEC,PREVIFEM) 0.25-35 MG-MCG tablet Take 1 tablet by mouth daily.    . ondansetron (ZOFRAN-ODT) 4 MG disintegrating tablet Take 4 mg by mouth every 8 (eight) hours as needed for nausea.    Marland Kitchen oxyCODONE-acetaminophen (PERCOCET) 10-325 MG tablet Take 1 tablet by mouth every 6 (six) hours as needed for pain.    . pantoprazole (PROTONIX) 40 MG tablet Take 40 mg by mouth 2 (two) times daily.    . promethazine (PHENERGAN) 25 MG tablet Take 25 mg by mouth every 6 (six) hours as needed for nausea.    . sertraline (ZOLOFT) 50 MG tablet Take 50  mg by mouth daily.    Marland Kitchen topiramate (TOPAMAX) 100 MG tablet Take 100 mg by mouth at bedtime.     . diclofenac sodium (VOLTAREN) 1 % GEL Apply 4 g topically 4 (four) times daily. To affected joint. 100 g 11   No current facility-administered medications for this visit.    Allergies  Allergen Reactions  . Other Nausea And Vomiting and Swelling    PRETZELS > ANGIOEDEMA, SWELLING OF TONGUE    Pt reports experienced n/v with epidural steroid injection - unknown name (allergies would not title as "unknown") Pretzel, Epidural steroid injection Pretzels causes tongue swelling Tongue swelling- Pretzels  . Bee Pollen Swelling    Angioedema   . Bee Venom     UNSPECIFIED REACTION   . Eggs Or Egg-Derived Products Other (See Comments)    Egg whites "makes the inside of my face burn"  [Can take flu shot]  . Lac Bovis Other (See Comments)    "makes the inside of my face burn"       Discussed warning signs or symptoms. Please see discharge instructions. Patient expresses understanding.   I personally was present and performed or re-performed the history, physical exam and medical decision-making activities of this service and have verified that the service and findings are accurately documented in the student's note. ___________________________________________ Clementeen Graham M.D., ABFM., CAQSM. Primary Care and Sports Medicine Adjunct Instructor of Family Medicine  [employer]

## 2018-06-23 ENCOUNTER — Telehealth: Payer: Self-pay | Admitting: Family Medicine

## 2018-06-23 NOTE — Telephone Encounter (Signed)
Dr. Denyse Amass    Due to Medicare going by last office note can you please addend the note and state why the patient is homebound and needs Home Health PT versus going to outpatient PT per [facility] from [place].   Thank you Arline Asp

## 2018-06-24 ENCOUNTER — Encounter: Payer: Self-pay | Admitting: Emergency Medicine

## 2018-06-24 ENCOUNTER — Other Ambulatory Visit: Payer: Self-pay

## 2018-06-24 ENCOUNTER — Emergency Department (INDEPENDENT_AMBULATORY_CARE_PROVIDER_SITE_OTHER)
Admission: EM | Admit: 2018-06-24 | Discharge: 2018-06-24 | Disposition: A | Discharge status: Home / Self Care | Payer: Medicare HMO | Source: Home / Self Care | Attending: Family Medicine | Admitting: Family Medicine

## 2018-06-24 DIAGNOSIS — K2100 Gastro-esophageal reflux disease with esophagitis, without bleeding: Secondary | ICD-10-CM

## 2018-06-24 DIAGNOSIS — Z8719 Personal history of other diseases of the digestive system: Secondary | ICD-10-CM

## 2018-06-24 MED ORDER — SUCRALFATE 1 GM/10ML PO SUSP: 1.0000 g | Freq: Four times a day (QID) | ORAL | 1 refills | Status: DC

## 2018-06-24 NOTE — Discharge Instructions (Addendum)
If symptoms become significantly worse during the night or over the weekend, proceed to the local emergency room.  
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
## 2018-06-24 NOTE — ED Triage Notes (Signed)
Chest pain x 3 days, Hurts while she is eating, she just ate chicken nuggets and fries, sharp pains, feels like her medicine is getting stuck in her throat.

## 2018-06-24 NOTE — Telephone Encounter (Signed)
Note updated.  Please reestablish home health physical therapy.

## 2018-06-25 ENCOUNTER — Ambulatory Visit (HOSPITAL_COMMUNITY): Payer: Medicare HMO | Admitting: Psychiatry

## 2018-06-25 ENCOUNTER — Telehealth: Payer: Self-pay

## 2018-06-25 NOTE — Telephone Encounter (Signed)
Jasmine Hickman calls stating that someone came out and assessed pt's knee this morning and it was very swollen and warm to the touch. Jasmine Hickman wanting to know if Dr Denyse Amass can recommend a knee brace for pt since it is so painful.   Please advise

## 2018-06-25 NOTE — Telephone Encounter (Signed)
Knee braces for the most part are generally not very helpful in this condition.  If not getting better we certainly can do a knee brace in the future.

## 2018-06-26 NOTE — Telephone Encounter (Signed)
Jasmine Hickman advised. They will wait and re-evaluate after pt's follow up later this month

## 2018-07-01 ENCOUNTER — Encounter: Payer: Self-pay | Admitting: Family Medicine

## 2018-07-01 ENCOUNTER — Ambulatory Visit (INDEPENDENT_AMBULATORY_CARE_PROVIDER_SITE_OTHER): Payer: Medicare HMO | Admitting: Family Medicine

## 2018-07-01 VITALS — BP 114/81 | HR 75 | Wt 292.0 lb

## 2018-07-01 DIAGNOSIS — Q682 Congenital deformity of knee: Secondary | ICD-10-CM

## 2018-07-01 DIAGNOSIS — M25561 Pain in right knee: Secondary | ICD-10-CM | POA: Diagnosis not present

## 2018-07-01 DIAGNOSIS — S83001A Unspecified subluxation of right patella, initial encounter: Secondary | ICD-10-CM

## 2018-07-01 NOTE — Progress Notes (Signed)
Jasmine Hickman is a 27 y.o. female who presents to [facility] today for right knee pain.  Actually returns to clinic continuing to complain of right knee.  This is been ongoing now since about late September (6-8 weeks).  She notes a sensation of popping and instability in her knee.  She feels as though her kneecap pops out of place.  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  She had trial of physical therapy and conservative management notes that has not been helpful at all and still quite painful.  Pain can be as bad as a 9 out of 10.  She notes instability locking and catching at times.    ROS:  As above  Exam:  BP 114/81   Pulse 75   Wt 292 lb (132.5 kg)   BMI 44.40 kg/m  General: Well Developed, well nourished, and in no acute distress.  Neuro/Psych: Alert and oriented x3, extra-ocular muscles intact, able to move all 4 extremities, sensation grossly intact. Skin: Warm and dry, no rashes noted.  Respiratory: Not using accessory muscles, speaking in full sentences, trachea midline.  Cardiovascular: Pulses palpable, no extremity edema. Abdomen: Does not appear distended. MSK:  Right knee difficult to appreciate due to body habitus but patella is more lateral than typical.  No other deformities present.  No erythema. Range of motion 0-100 degrees. Lateral patella tracking. Positive patellar apprehension test. Intact flexion and extension strength. Stable ligamentous exam.    Lab and Radiology Results EXAM: LEFT KNEE - 1-2 VIEW; RIGHT KNEE - COMPLETE 4+ VIEW  COMPARISON:  02/24/2013 left knee plain film exam.  FINDINGS: Left knee two views:  No significant tibiofemoral joint space narrowing. No fracture or dislocation. No osseous abnormality noted.  Right knee four views: No significant joint space narrowing. No fracture or dislocation. Slightly high  riding patella. Question trace suprapatellar joint fluid.  IMPRESSION: 1. Mild right-sided patella [name]. 2. Question trace right suprapatellar joint effusion. 3. Otherwise negative.   Electronically Signed   By: Lacy Duverney M.D.   On: 06/22/2018 15:32 I personally (independently) visualized and performed the interpretation of the images attached in this note.   Procedure: Real-time Ultrasound Guided Injection of Right knee  Device: GE Logiq E   Images permanently stored and available for review in the ultrasound unit. Verbal informed consent obtained.  Discussed risks and benefits of procedure. Warned about infection bleeding damage to structures skin hypopigmentation and fat atrophy among others. Patient expresses understanding and agreement Time-out conducted.   Noted no overlying erythema, induration, or other signs of local infection.   Skin prepped in a sterile fashion.   Local anesthesia: Topical Ethyl chloride.   With sterile technique and under real time ultrasound guidance:  80mg  depomedrol and 4ml marcaine injected easily.   Completed without difficulty   Pain immediately resolved suggesting accurate placement of the medication.   Advised to call if fevers/chills, erythema, induration, drainage, or persistent bleeding.   Images permanently stored and available for review in the ultrasound unit.  Impression: Technically successful ultrasound guided injection.     Assessment and Plan: 27 y.o. female with  Right knee pain and instability.  Very likely patellar subluxation events.  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  Plan for MRI to evaluate for MPFL tear and potential surgical planning.  Injection today for pain control.  Continue  to work on quad strengthening and lateral stabilization with physical therapy.  I do not think she is a good candidate for lateral patellar stabilizing brace due to her body habitus.  She is morbidly  obese and her leg is effectively cone-shaped and I think she will have a very difficult time keeping the knee brace on   Orders Placed This Encounter  Procedures  . MR Knee Right Wo Contrast    Standing Status:   Future    Standing Expiration Date:   09/01/2019    Order Specific Question:   What is the patient's sedation requirement?    Answer:   No Sedation    Order Specific Question:   Does the patient have a pacemaker or implanted devices?    Answer:   No    Order Specific Question:   Preferred imaging location?    Answer:   Licensed conveyancer (table limit-350lbs)    Order Specific Question:   Radiology Contrast Protocol - do NOT remove file path    Answer:   \\[email or web address]   No orders of the defined types were placed in this encounter.   Historical information moved to improve visibility of documentation.  Past Medical History:  Diagnosis Date  . Acute thrombosis of right cephalic vein   . ADHD   . ADHD (attention deficit hyperactivity disorder)   . Asthma   . Autism   . Depression   . Esophagitis   . GERD (gastroesophageal reflux disease)   . Thyroid disease   . Ulcerative colitis [facility])    Past Surgical History:  Procedure Laterality Date  . KNEE ARTHROSCOPY    . LUMBAR LAMINECTOMY/DECOMPRESSION MICRODISCECTOMY Right 05/28/2018   Procedure: Right Lumbar Five Sacral One Microdiscectomy;  Surgeon: Coletta Memos, MD;  Location: [facility] OR;  Service: Neurosurgery;  Laterality: Right;  Right Lumbar Five Sacral One Microdiscectomy   Social History   Tobacco Use  . Smoking status: Never Smoker  . Smokeless tobacco: Never Used  Substance Use Topics  . Alcohol use: Yes    Comment: wine ocassionally   family history includes Diabetes in her father and mother; Hyperlipidemia in her mother; Thyroid disease in her mother.  Medications: Current Outpatient Medications  Medication Sig Dispense Refill  . albuterol (PROVENTIL HFA;VENTOLIN HFA) 108  (90 Base) MCG/ACT inhaler Inhale 1-2 puffs into the lungs every 6 (six) hours as needed for wheezing or shortness of breath. 1 Inhaler 0  . ARIPiprazole (ABILIFY) 5 MG tablet Take 5 mg by mouth daily.    . baclofen (LIORESAL) 10 MG tablet Take 1 tablet (10 mg total) by mouth 3 (three) times daily as needed for muscle spasms. 90 each 2  . Botulinum Toxin Type A (BOTOX) 200 units SOLR Inject as directed every 3 (three) months.     . budesonide-formoterol (SYMBICORT) 160-4.5 MCG/ACT inhaler Inhale 2 puffs into the lungs 2 (two) times daily as needed (asthma).     . diclofenac sodium (VOLTAREN) 1 % GEL Apply 4 g topically 4 (four) times daily. To affected joint. 100 g 11  . dicyclomine (BENTYL) 10 MG capsule Take 10 mg by mouth 4 (four) times daily as needed for spasms.     Marland Kitchen EPINEPHrine 0.3 mg/0.3 mL IJ SOAJ injection Inject into the muscle once.    . gabapentin (NEURONTIN) 300 MG capsule Take 3 capsules (900 mg total) by mouth 3 (three) times daily. 810 capsule 1  . HYDROcodone-acetaminophen (NORCO/VICODIN) 5-325 MG tablet Take 1 tablet by mouth every 6 (six)  hours as needed (pain). 60 tablet 0  . levothyroxine (SYNTHROID, LEVOTHROID) 100 MCG tablet Take 100 mcg by mouth daily before breakfast.     . mesalamine (APRISO) 0.375 G 24 hr capsule Take 375 mg by mouth 4 (four) times daily.    . metoCLOPramide (REGLAN) 10 MG tablet Take 1 tablet (10 mg total) by mouth every 8 (eight) hours as needed (for nausea). 10 tablet 0  . norgestimate-ethinyl estradiol (ORTHO-CYCLEN,SPRINTEC,PREVIFEM) 0.25-35 MG-MCG tablet Take 1 tablet by mouth daily.    . ondansetron (ZOFRAN-ODT) 4 MG disintegrating tablet Take 4 mg by mouth every 8 (eight) hours as needed for nausea.    Marland Kitchen oxyCODONE-acetaminophen (PERCOCET) 10-325 MG tablet Take 1 tablet by mouth every 6 (six) hours as needed for pain.    . pantoprazole (PROTONIX) 40 MG tablet Take 40 mg by mouth 2 (two) times daily.    . promethazine (PHENERGAN) 25 MG tablet Take 25  mg by mouth every 6 (six) hours as needed for nausea.    . sertraline (ZOLOFT) 50 MG tablet Take 50 mg by mouth daily.    . sucralfate (CARAFATE) 1 GM/10ML suspension Take 10 mLs (1 g total) by mouth 4 (four) times daily. 420 mL 1  . topiramate (TOPAMAX) 100 MG tablet Take 100 mg by mouth at bedtime.      No current facility-administered medications for this visit.    Allergies  Allergen Reactions  . Other Nausea And Vomiting and Swelling    PRETZELS > ANGIOEDEMA, SWELLING OF TONGUE    Pt reports experienced n/v with epidural steroid injection - unknown name (allergies would not title as "unknown") Pretzel, Epidural steroid injection Pretzels causes tongue swelling Tongue swelling- Pretzels  . Bee Pollen Swelling    Angioedema   . Bee Venom     UNSPECIFIED REACTION   . Eggs Or Egg-Derived Products Other (See Comments)    Egg whites "makes the inside of my face burn"  [Can take flu shot]  . Lac Bovis Other (See Comments)    "makes the inside of my face burn"       Discussed warning signs or symptoms. Please see discharge instructions. Patient expresses understanding.

## 2018-07-01 NOTE — Patient Instructions (Signed)
Thank you for coming in today. Continue physical therapy.  You should hear about MRI.  Recheck as needed.  Follow up after MRI.   Call or go to the ER if you develop a large red swollen joint with extreme pain or oozing puss.    Patellar Dislocation and Subluxation The kneecap (patella) is located in a groove at the end of the thigh bone (femur). Patellar dislocation and patellar subluxation are injuries that happen when the patella slips out of its normal position. In a patellar subluxation, the patella slips partly out of the groove. In a patellar dislocation, it slips all the way out of the groove. What are the causes? This condition may be caused by:  A hit to the knee.  Twisting the knee when the foot is planted.  What increases the risk? This condition is more likely to develop in:  Athletes in their teens or 71s.  People who have had this condition before.  People who play certain kinds of sports, including: ? Sports that include quick turns or changes in direction, or where there is contact, like soccer. ? Sports that require jumping, such as basketball or volleyball. ? Sports in which cleats are worn.  What are the signs or symptoms? Symptoms of this condition include:  Sudden pain in the knee.  A misshapen knee.  A popping sensation, followed by a feeling that something is out of place.  Inability to bend or straighten the knee.  Swelling in the knee.  How is this diagnosed? This condition may be diagnosed with:  A physical exam.  An X-ray exam. This may be done to see the position of the patella or to see if a bone has broken.  MRI. This may be done to look at the alignment of your knee and the ligaments that hold your patella in place.  How is this treated? Your patella may move back into place on its own when you straighten your knee. If your patella does not move back into place on its own, your health care provider will move it back into place.  After your patella is back in its normal position, treatment may involve:  Wearing a knee brace to keep your knee from moving (keep it immobilized) while it heals.  Doing exercises that help improve strength and movement in your knee.  Taking medicine to help with pain and inflammation.  Applying ice to the knee to help with pain and inflammation.  Having surgery to prevent the patella from slipping out of place or to clean out any loose cartilage in your joint. This may be needed if other treatments do not help or if the condition keeps happening.  Follow these instructions at home: If you have a brace:  Wear it as told by your health care provider. Remove it only as told by your health care provider.  Loosen the brace if your toes tingle, become numb, or turn cold and blue.  Do not let your brace get wet if it is not waterproof.  Keep the brace clean.  If your brace is not waterproof, cover it with a watertight covering&nbsp;when you take a bath or a shower. Managing pain, stiffness, and swelling  If directed, apply ice to the injured area. ? Put ice in a plastic bag. ? Place a towel between your skin and the bag. ? Leave the ice on for 20 minutes, 2-3 times a day.  Move your toes often to avoid stiffness and to lessen swelling.  Activity  Return to your normal activities as told by your health care provider. Ask your health care provider what activities are safe for you.  Do exercises as told by your health care provider. General instructions  Do not use the injured limb to support your body weight until your health care provider says that you can. Use crutches as told by your health care provider.  Take over-the-counter and prescription medicines only as told by your health care provider.  Keep all follow-up visits as told by your health care provider. This is important. How is this prevented?  Warm up and stretch before being active.  Cool down and stretch after  being active.  Give your body time to rest between periods of activity.  Make sure to use equipment that fits you.  Be safe and responsible while being active to avoid falls.  Do at least 150 minutes of moderate-intensity exercise each week, such as brisk walking or water aerobics.  Maintain physical fitness, including: ? Strength. ? Flexibility. ? Cardiovascular fitness. ? Endurance. Get help right away if:  The pain in your knee gets worse and is not relieved by medicine.  The inflammation in your knee gets worse.  Your knee catches or locks. This information is not intended to replace advice given to you by your health care provider. Make sure you discuss any questions you have with your health care provider. Document Released: 08/12/2005 Document Revised: 04/16/2016 Document Reviewed: 06/24/2015 Elsevier Interactive Patient Education  [employer].

## 2018-07-04 ENCOUNTER — Emergency Department (INDEPENDENT_AMBULATORY_CARE_PROVIDER_SITE_OTHER)
Admission: EM | Admit: 2018-07-04 | Discharge: 2018-07-04 | Disposition: A | Discharge status: Home / Self Care | Payer: Medicare HMO | Source: Home / Self Care | Attending: Family Medicine | Admitting: Family Medicine

## 2018-07-04 ENCOUNTER — Other Ambulatory Visit: Payer: Self-pay

## 2018-07-04 DIAGNOSIS — M25561 Pain in right knee: Secondary | ICD-10-CM

## 2018-07-04 DIAGNOSIS — G8929 Other chronic pain: Secondary | ICD-10-CM

## 2018-07-04 NOTE — Discharge Instructions (Addendum)
Apply ice pack for 20 to 30 minutes, 3 to 4 times daily  Continue until pain and swelling decrease.  Use crutches to minimize weight bearing.   Apply ace wrap for 3 to 4 days until swelling decreases.
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
## 2018-07-04 NOTE — ED Triage Notes (Signed)
Pt c/o intermittent RT knee pain x 1 mos. Denies injury. Cant bare much weight on it. Says it locks up sometimes. Pain level 9/10

## 2018-07-06 ENCOUNTER — Ambulatory Visit (INDEPENDENT_AMBULATORY_CARE_PROVIDER_SITE_OTHER): Payer: Medicare HMO | Admitting: Family Medicine

## 2018-07-06 ENCOUNTER — Encounter: Payer: Self-pay | Admitting: Family Medicine

## 2018-07-06 VITALS — BP 117/72 | HR 79 | Wt 299.0 lb

## 2018-07-06 DIAGNOSIS — M25561 Pain in right knee: Secondary | ICD-10-CM

## 2018-07-06 DIAGNOSIS — S83001A Unspecified subluxation of right patella, initial encounter: Secondary | ICD-10-CM

## 2018-07-06 NOTE — Progress Notes (Signed)
Jasmine Hickman is a 27 y.o. 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 She notes that is it clicking more often than the last time she was seen. Jasmine Hickman received steroid injection on 11/06. She reports that this injection was not helpful at all. She went to urgent care on 11/09 for continued right knee pain. They wrapped it in an ACE bandage, and Jasmine Hickman reports that this was not helpful. She has been using diclofenac gel four times per day, without relief. She would like to stop using diclofenac gel.    ROS:  As above  Exam:  BP 117/72   Pulse 79   Wt 299 lb (135.6 kg)   BMI 45.46 kg/m  General: Well Developed, well nourished, and in no acute distress.  Neuro/Psych: Alert and oriented x3, extra-ocular muscles intact, able to move all 4 extremities, sensation grossly intact. Skin: Warm and dry, no rashes noted.  Respiratory: Not using accessory muscles, speaking in full sentences, trachea midline.  Cardiovascular: Pulses palpable, no extremity edema. Abdomen: Does not appear distended. MSK: Right knee obese relatively normal-appearing. Patella alta and slight lateral tracking present. Stable ligamentous exam.    Lab and Radiology Results Dg Knee 1-2 Views Left  Result Date: 06/22/2018 CLINICAL DATA:  27 year old female with right knee pain for the past month. Initial encounter. EXAM: LEFT KNEE - 1-2 VIEW; RIGHT KNEE - COMPLETE 4+ VIEW COMPARISON:  02/24/2013 left knee plain film exam. FINDINGS: Left knee two views: No significant tibiofemoral joint space narrowing. No fracture or dislocation. No osseous abnormality noted. Right knee four views: No significant joint space narrowing. No fracture or dislocation. Slightly high riding patella. Question trace suprapatellar joint fluid. IMPRESSION: 1. Mild right-sided patella [name]. 2. Question trace right suprapatellar joint effusion. 3.  Otherwise negative. Electronically Signed   By: Lacy Duverney M.D.   On: 06/22/2018 15:32   Dg Knee Complete 4 Views Right  Result Date: 06/22/2018 CLINICAL DATA:  27 year old female with right knee pain for the past month. Initial encounter. EXAM: LEFT KNEE - 1-2 VIEW; RIGHT KNEE - COMPLETE 4+ VIEW COMPARISON:  02/24/2013 left knee plain film exam. FINDINGS: Left knee two views: No significant tibiofemoral joint space narrowing. No fracture or dislocation. No osseous abnormality noted. Right knee four views: No significant joint space narrowing. No fracture or dislocation. Slightly high riding patella. Question trace suprapatellar joint fluid. IMPRESSION: 1. Mild right-sided patella [name]. 2. Question trace right suprapatellar joint effusion. 3. Otherwise negative. Electronically Signed   By: Lacy Duverney M.D.   On: 06/22/2018 15:32   I personally (independently) visualized and performed the interpretation of the images attached in this note.'  Assessment and Plan: 27 y.o. 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  [name] continues to have knee pain and clicking. She can stop using the diclofenac gel since it is not helping.  Plan for trial of patellar stabilizing brace while she waits for her MRI. MRI scheduled one week from today (11/18) and follow-up appointment on 11/20 to review results.   I spent 15 minutes with this patient, greater than 50% was face-to-face time counseling regarding ddx and plan and brace.Marland Kitchen     Historical information moved to improve visibility of documentation.  Past Medical History:  Diagnosis Date  . Acute thrombosis of right cephalic vein   . ADHD   . ADHD (attention deficit hyperactivity  disorder)   . Asthma   . Autism   . Depression   . Esophagitis   . GERD (gastroesophageal reflux disease)   . Thyroid disease   . Ulcerative colitis [facility])    Past Surgical History:  Procedure Laterality Date  .  KNEE ARTHROSCOPY    . LUMBAR LAMINECTOMY/DECOMPRESSION MICRODISCECTOMY Right 05/28/2018   Procedure: Right Lumbar Five Sacral One Microdiscectomy;  Surgeon: Coletta Memos, MD;  Location: [facility] OR;  Service: Neurosurgery;  Laterality: Right;  Right Lumbar Five Sacral One Microdiscectomy   Social History   Tobacco Use  . Smoking status: Never Smoker  . Smokeless tobacco: Never Used  Substance Use Topics  . Alcohol use: Yes    Comment: wine ocassionally   family history includes Diabetes in her father and mother; Hyperlipidemia in her mother; Thyroid disease in her mother.  Medications: Current Outpatient Medications  Medication Sig Dispense Refill  . albuterol (PROVENTIL HFA;VENTOLIN HFA) 108 (90 Base) MCG/ACT inhaler Inhale 1-2 puffs into the lungs every 6 (six) hours as needed for wheezing or shortness of breath. 1 Inhaler 0  . ARIPiprazole (ABILIFY) 5 MG tablet Take 5 mg by mouth daily.    . baclofen (LIORESAL) 10 MG tablet Take 1 tablet (10 mg total) by mouth 3 (three) times daily as needed for muscle spasms. 90 each 2  . Botulinum Toxin Type A (BOTOX) 200 units SOLR Inject as directed every 3 (three) months.     . budesonide-formoterol (SYMBICORT) 160-4.5 MCG/ACT inhaler Inhale 2 puffs into the lungs 2 (two) times daily as needed (asthma).     . diclofenac sodium (VOLTAREN) 1 % GEL Apply 4 g topically 4 (four) times daily. To affected joint. 100 g 11  . dicyclomine (BENTYL) 10 MG capsule Take 10 mg by mouth 4 (four) times daily as needed for spasms.     Marland Kitchen EPINEPHrine 0.3 mg/0.3 mL IJ SOAJ injection Inject into the muscle once.    . gabapentin (NEURONTIN) 300 MG capsule Take 3 capsules (900 mg total) by mouth 3 (three) times daily. 810 capsule 1  . HYDROcodone-acetaminophen (NORCO/VICODIN) 5-325 MG tablet Take 1 tablet by mouth every 6 (six) hours as needed (pain). 60 tablet 0  . levothyroxine (SYNTHROID, LEVOTHROID) 100 MCG tablet Take 100 mcg by mouth daily before breakfast.     .  mesalamine (APRISO) 0.375 G 24 hr capsule Take 375 mg by mouth 4 (four) times daily.    . metoCLOPramide (REGLAN) 10 MG tablet Take 1 tablet (10 mg total) by mouth every 8 (eight) hours as needed (for nausea). 10 tablet 0  . norgestimate-ethinyl estradiol (ORTHO-CYCLEN,SPRINTEC,PREVIFEM) 0.25-35 MG-MCG tablet Take 1 tablet by mouth daily.    . ondansetron (ZOFRAN-ODT) 4 MG disintegrating tablet Take 4 mg by mouth every 8 (eight) hours as needed for nausea.    Marland Kitchen oxyCODONE-acetaminophen (PERCOCET) 10-325 MG tablet Take 1 tablet by mouth every 6 (six) hours as needed for pain.    . pantoprazole (PROTONIX) 40 MG tablet Take 40 mg by mouth 2 (two) times daily.    . promethazine (PHENERGAN) 25 MG tablet Take 25 mg by mouth every 6 (six) hours as needed for nausea.    . sertraline (ZOLOFT) 50 MG tablet Take 50 mg by mouth daily.    . sucralfate (CARAFATE) 1 GM/10ML suspension Take 10 mLs (1 g total) by mouth 4 (four) times daily. 420 mL 1  . topiramate (TOPAMAX) 100 MG tablet Take 100 mg by mouth at bedtime.  No current facility-administered medications for this visit.    Allergies  Allergen Reactions  . Other Nausea And Vomiting and Swelling    PRETZELS > ANGIOEDEMA, SWELLING OF TONGUE    Pt reports experienced n/v with epidural steroid injection - unknown name (allergies would not title as "unknown") Pretzel, Epidural steroid injection Pretzels causes tongue swelling Tongue swelling- Pretzels  . Bee Pollen Swelling    Angioedema   . Bee Venom     UNSPECIFIED REACTION   . Eggs Or Egg-Derived Products Other (See Comments)    Egg whites "makes the inside of my face burn"  [Can take flu shot]  . Lac Bovis Other (See Comments)    "makes the inside of my face burn"       Discussed warning signs or symptoms. Please see discharge instructions. Patient expresses understanding.  .I personally was present and performed or re-performed the history, physical exam and medical decision-making  activities of this service and have verified that the service and findings are accurately documented in the student's note. ___________________________________________ Clementeen Graham M.D., ABFM., CAQSM. Primary Care and Sports Medicine Adjunct Instructor of Family Medicine  [employer]

## 2018-07-06 NOTE — Patient Instructions (Addendum)
Thank you for coming in today. MRI is next Monday Nov 18th at 9:15. Be there about 20 mins early.  Recheck on Wendsday after the MRI to go over the results.   Use the brace as needed.

## 2018-07-13 ENCOUNTER — Ambulatory Visit (INDEPENDENT_AMBULATORY_CARE_PROVIDER_SITE_OTHER): Payer: Medicare HMO

## 2018-07-13 DIAGNOSIS — Q682 Congenital deformity of knee: Secondary | ICD-10-CM

## 2018-07-13 DIAGNOSIS — S83001A Unspecified subluxation of right patella, initial encounter: Secondary | ICD-10-CM

## 2018-07-13 DIAGNOSIS — M25561 Pain in right knee: Secondary | ICD-10-CM

## 2018-07-13 DIAGNOSIS — M7651 Patellar tendinitis, right knee: Secondary | ICD-10-CM

## 2018-07-15 ENCOUNTER — Ambulatory Visit (INDEPENDENT_AMBULATORY_CARE_PROVIDER_SITE_OTHER): Payer: Medicare HMO | Admitting: Family Medicine

## 2018-07-15 ENCOUNTER — Encounter: Payer: Self-pay | Admitting: Family Medicine

## 2018-07-15 VITALS — BP 108/76 | HR 71 | Wt 291.0 lb

## 2018-07-15 DIAGNOSIS — S83001A Unspecified subluxation of right patella, initial encounter: Secondary | ICD-10-CM

## 2018-07-15 NOTE — Patient Instructions (Signed)
Thank you for coming in today. You should hear from orthopedic surgery about your knee.  Continue home exercises and PT.  Bring the CD of the MRI to the appointment.  Recheck with me as needed.
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
## 2018-07-21 ENCOUNTER — Telehealth: Payer: Self-pay

## 2018-07-21 MED ORDER — HYDROCODONE-ACETAMINOPHEN 5-325 MG PO TABS: 1.0000 | ORAL_TABLET | Freq: Four times a day (QID) | ORAL | 0 refills | Status: DC | PRN

## 2018-07-21 NOTE — Telephone Encounter (Signed)
Patient called stated that she is in a lot of pain and she thinks she needs emergency surgery. Patient is requesting a pain medication sent to her pharmacy[facility] Please advise.  ,CMA

## 2018-07-21 NOTE — Telephone Encounter (Signed)
Please route to patient.

## 2018-07-21 NOTE — Telephone Encounter (Signed)
I do not think there is an emergency surgery to do.  I can provide temporary pain control but we may need to get more longer-term pain medicine from your regular doctor or potentially a pain management doctor. Medicine sent to pharmacy.  [facility] [place] [facility] [place] [facility].

## 2018-07-21 NOTE — Telephone Encounter (Signed)
Pt and her mother advised.

## 2018-07-21 NOTE — Telephone Encounter (Signed)
Attempted to contact Pt, no answer and VM is full.  

## 2018-07-27 ENCOUNTER — Emergency Department (HOSPITAL_COMMUNITY)
Admission: EM | Admit: 2018-07-27 | Discharge: 2018-07-28 | Disposition: A | Discharge status: Home / Self Care | Payer: Medicare HMO | Attending: Emergency Medicine | Admitting: Emergency Medicine

## 2018-07-27 ENCOUNTER — Encounter (HOSPITAL_COMMUNITY): Payer: Self-pay | Admitting: Emergency Medicine

## 2018-07-27 ENCOUNTER — Other Ambulatory Visit: Payer: Self-pay

## 2018-07-27 DIAGNOSIS — G8929 Other chronic pain: Secondary | ICD-10-CM | POA: Diagnosis not present

## 2018-07-27 DIAGNOSIS — Z79899 Other long term (current) drug therapy: Secondary | ICD-10-CM | POA: Diagnosis not present

## 2018-07-27 DIAGNOSIS — F840 Autistic disorder: Secondary | ICD-10-CM | POA: Diagnosis not present

## 2018-07-27 DIAGNOSIS — J45909 Unspecified asthma, uncomplicated: Secondary | ICD-10-CM | POA: Insufficient documentation

## 2018-07-27 DIAGNOSIS — E039 Hypothyroidism, unspecified: Secondary | ICD-10-CM | POA: Diagnosis not present

## 2018-07-27 DIAGNOSIS — M25561 Pain in right knee: Secondary | ICD-10-CM | POA: Diagnosis not present

## 2018-07-27 NOTE — ED Triage Notes (Addendum)
Pt reports right knee that became severe tonight, has been painful for 3 months. Pt is wearing a brace, denies any injuries. Pt reports she feels like her knee is "popping" randomly. Pt had MRI from orthopedics and has attempted following up with surgery without success. Pt has been unable to ambulate, using crutches with difficulty. Pain 10/10.

## 2018-07-28 DIAGNOSIS — M25561 Pain in right knee: Secondary | ICD-10-CM | POA: Diagnosis not present

## 2018-07-28 MED ORDER — OXYCODONE-ACETAMINOPHEN 5-325 MG PO TABS
1.0000 | ORAL_TABLET | Freq: Once | ORAL | Status: AC
  Administered 2018-07-28: 1 via ORAL
  Filled 2018-07-28: qty 1

## 2018-07-28 NOTE — Discharge Instructions (Addendum)
I have given you a prescription today on paper for a walker, you may take this to a medical supply store and get a walker.    Please take Ibuprofen (Advil, motrin) and Tylenol (acetaminophen) to relieve your pain.  You may take up to 600 MG (3 pills) of normal strength ibuprofen every 8 hours as needed.  In between doses of ibuprofen you make take tylenol, up to 1,000 mg (two extra strength pills).  Do not take more than 3,000 mg tylenol in a 24 hour period.  Please check all medication labels as many medications such as pain and cold medications may contain tylenol.  Do not drink alcohol while taking these medications.  Do not take other NSAID'S while taking ibuprofen (such as aleve or naproxen).  Please take ibuprofen with food to decrease stomach upset.  Ibuprofen and other NSAIDS can cause bleeding from your gastrointestinal tract.  This risk may be higher with your ulcerative colitis.  Please discuss this with your primary care doctor.
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
## 2018-09-27 ENCOUNTER — Other Ambulatory Visit: Payer: Self-pay

## 2018-09-27 ENCOUNTER — Emergency Department (HOSPITAL_BASED_OUTPATIENT_CLINIC_OR_DEPARTMENT_OTHER)
Admission: EM | Admit: 2018-09-27 | Discharge: 2018-09-28 | Disposition: A | Discharge status: Home / Self Care | Payer: Medicare HMO | Attending: Emergency Medicine | Admitting: Emergency Medicine

## 2018-09-27 ENCOUNTER — Encounter (HOSPITAL_BASED_OUTPATIENT_CLINIC_OR_DEPARTMENT_OTHER): Payer: Self-pay | Admitting: Emergency Medicine

## 2018-09-27 ENCOUNTER — Emergency Department (HOSPITAL_BASED_OUTPATIENT_CLINIC_OR_DEPARTMENT_OTHER): Payer: Medicare HMO

## 2018-09-27 DIAGNOSIS — M25561 Pain in right knee: Secondary | ICD-10-CM | POA: Diagnosis present

## 2018-09-27 DIAGNOSIS — J45909 Unspecified asthma, uncomplicated: Secondary | ICD-10-CM | POA: Diagnosis not present

## 2018-09-27 DIAGNOSIS — F909 Attention-deficit hyperactivity disorder, unspecified type: Secondary | ICD-10-CM | POA: Diagnosis not present

## 2018-09-27 DIAGNOSIS — F840 Autistic disorder: Secondary | ICD-10-CM | POA: Insufficient documentation

## 2018-09-27 DIAGNOSIS — Z79899 Other long term (current) drug therapy: Secondary | ICD-10-CM | POA: Insufficient documentation

## 2018-09-27 DIAGNOSIS — G8929 Other chronic pain: Secondary | ICD-10-CM | POA: Insufficient documentation

## 2018-09-27 MED ORDER — NAPROXEN 250 MG PO TABS
500.0000 mg | ORAL_TABLET | Freq: Once | ORAL | Status: AC
  Administered 2018-09-28: 500 mg via ORAL
  Filled 2018-09-27: qty 2

## 2018-09-27 NOTE — ED Triage Notes (Signed)
Patient states that she has fallen 25 xs in the last month. She reports that she has right knee pain

## 2018-09-27 NOTE — ED Provider Notes (Signed)
[facility] Provider Note: Lowella DellJ. Lane Stephan Draughn, MD, FACEP  CSN: [id number] MRN: [id number] ARRIVAL: 09/27/18 at 2213 ROOM: MH12/[facility]12   CHIEF COMPLAINT  Knee Pain   HISTORY OF PRESENT ILLNESS  09/27/18 11:47 PM Jasmine Hickman is a 28 y.o. female with a "7588-month" history of pain in her right knee.  She states it locks up "all the time".  Most recently it locked up after dinner this evening.  She states the pain in her right knee is a 1000 out of 10.  She states she is fallen more than 25 times in the past month because of this knee.  She has seen 3 specialists about her knee who have advised her, after MRI, that her problem is nonsurgical.  She has taken hydrocodone for it in the past and has had multiple prescriptions for hydrocodone and other opioids in the past 2 years.  She has not been taking any NSAIDs.   Past Medical History:  Diagnosis Date  . Acute thrombosis of right cephalic vein   . ADHD   . Asthma   . Autism   . Depression   . Esophagitis   . GERD (gastroesophageal reflux disease)   . Thyroid disease   . Ulcerative colitis [facility](HCC)     Past Surgical History:  Procedure Laterality Date  . KNEE ARTHROSCOPY    . LUMBAR LAMINECTOMY/DECOMPRESSION MICRODISCECTOMY Right 05/28/2018   Procedure: Right Lumbar Five Sacral One Microdiscectomy;  Surgeon: Coletta Memosabbell, Kyle, MD;  Location: [facility] OR;  Service: Neurosurgery;  Laterality: Right;  Right Lumbar Five Sacral One Microdiscectomy    Family History  Problem Relation Age of Onset  . Hyperlipidemia Mother   . Thyroid disease Mother   . Diabetes Mother   . Diabetes Father     Social History   Tobacco Use  . Smoking status: Never Smoker  . Smokeless tobacco: Never Used  Substance Use Topics  . Alcohol use: Yes    Comment: wine ocassionally  . Drug use: No    Prior to Admission medications   Medication Sig Start Date End Date Taking? Authorizing Provider  albuterol (PROVENTIL HFA;VENTOLIN HFA) 108 (90 Base)  MCG/ACT inhaler Inhale 1-2 puffs into the lungs every 6 (six) hours as needed for wheezing or shortness of breath. 01/16/18   Elvina SidleLauenstein, Kurt, MD  ARIPiprazole (ABILIFY) 5 MG tablet Take 5 mg by mouth daily.    [provider]  baclofen (LIORESAL) 10 MG tablet Take 1 tablet (10 mg total) by mouth 3 (three) times daily as needed for muscle spasms. 05/29/18   Costella, Darci CurrentVincent J, PA-C  Botulinum Toxin Type A (BOTOX) 200 units SOLR Inject as directed every 3 (three) months.     [provider]  budesonide-formoterol (SYMBICORT) 160-4.5 MCG/ACT inhaler Inhale 2 puffs into the lungs 2 (two) times daily as needed (asthma).     [provider]  diclofenac sodium (VOLTAREN) 1 % GEL Apply 4 g topically 4 (four) times daily. To affected joint. 06/22/18   Rodolph Bongorey, Evan S, MD  dicyclomine (BENTYL) 10 MG capsule Take 10 mg by mouth 4 (four) times daily as needed for spasms.     [provider]  EPINEPHrine 0.3 mg/0.3 mL IJ SOAJ injection Inject into the muscle once.    [provider]  gabapentin (NEURONTIN) 300 MG capsule Take 3 capsules (900 mg total) by mouth 3 (three) times daily. 05/01/18 07/30/18  Rodolph Bongorey, Evan S, MD  HYDROcodone-acetaminophen (NORCO/VICODIN) 5-325 MG tablet Take 1 tablet by mouth  every 6 (six) hours as needed (pain). 07/21/18   Rodolph Bongorey, Evan S, MD  levothyroxine (SYNTHROID, LEVOTHROID) 100 MCG tablet Take 100 mcg by mouth daily before breakfast.     [provider]  mesalamine (APRISO) 0.375 G 24 hr capsule Take 375 mg by mouth 4 (four) times daily.    [provider]  metoCLOPramide (REGLAN) 10 MG tablet Take 1 tablet (10 mg total) by mouth every 8 (eight) hours as needed (for nausea). 02/05/13   Gurpreet Mariani, MD  norgestimate-ethinyl estradiol (ORTHO-CYCLEN,SPRINTEC,PREVIFEM) 0.25-35 MG-MCG tablet Take 1 tablet by mouth daily. 05/04/18   [provider]  ondansetron (ZOFRAN-ODT) 4 MG disintegrating tablet Take 4 mg by mouth every 8  (eight) hours as needed for nausea.    [provider]  oxyCODONE-acetaminophen (PERCOCET) 10-325 MG tablet Take 1 tablet by mouth every 6 (six) hours as needed for pain.    [provider]  pantoprazole (PROTONIX) 40 MG tablet Take 40 mg by mouth 2 (two) times daily.    [provider]  promethazine (PHENERGAN) 25 MG tablet Take 25 mg by mouth every 6 (six) hours as needed for nausea.    [provider]  sertraline (ZOLOFT) 50 MG tablet Take 50 mg by mouth daily.    [provider]  sucralfate (CARAFATE) 1 GM/10ML suspension Take 10 mLs (1 g total) by mouth 4 (four) times daily. 06/24/18 06/24/19  Lattie HawBeese, Stephen A, MD  topiramate (TOPAMAX) 100 MG tablet Take 100 mg by mouth at bedtime.     [provider]    Allergies Other; Bee pollen; Bee venom; Eggs or egg-derived products; and Lac bovis   REVIEW OF SYSTEMS  Negative except as noted here or in the History of Present Illness.   PHYSICAL EXAMINATION  Initial Vital Signs Blood pressure (!) 126/93, pulse 83, temperature 97.8 F (36.6 C), temperature source Oral, resp. rate 18, height 5\' 8"  (1.727 m), weight 104.3 kg, last menstrual period 08/02/2018, SpO2 100 %.  Examination General: Well-developed, well-nourished female in no acute distress; appearance consistent with age of record HENT: normocephalic; atraumatic Eyes: pupils equal, round and reactive to light; extraocular muscles intact Neck: supple Heart: regular rate and rhythm Lungs: clear to auscultation bilaterally Abdomen: soft; nondistended; nontender; no masses or hepatosplenomegaly; bowel sounds present Extremities: No deformity; tenderness of right knee with positive anterior drawer test, positive posterior drawer test, pain on valgus and varus stress, no appreciable effusion; pulses normal Neurologic: Awake, alert and oriented; motor function intact in all extremities and symmetric; no facial droop Skin: Warm and  dry Psychiatric: Flat affect   RESULTS  Summary of this visit's results, reviewed by myself:   EKG Interpretation  Date/Time:    Ventricular Rate:    PR Interval:    QRS Duration:   QT Interval:    QTC Calculation:   R Axis:     Text Interpretation:        Laboratory Studies: No results found for this or any previous visit (from the past 24 hour(s)). Imaging Studies: Dg Knee Complete 4 Views Right  Result Date: 09/27/2018 CLINICAL DATA:  Chronic RIGHT knee pain for 5 months. Multiple recent falls. EXAM: RIGHT KNEE - COMPLETE 4+ VIEW COMPARISON:  06/22/2018 and prior radiographs FINDINGS: No acute fracture, subluxation or dislocation. A small knee effusion is present. No focal bony lesions are present. The joint spaces are otherwise unremarkable. IMPRESSION: Small knee effusion without other significant abnormality. Electronically Signed   By: Henrietta HooverJeffrey  Hu M.D.  On: 09/27/2018 22:48    ED COURSE and MDM  Nursing notes and initial vitals signs, including pulse oximetry, reviewed.  Vitals:   09/27/18 2216 09/27/18 2218  BP:  (!) 126/93  Pulse:  83  Resp:  18  Temp:  97.8 F (36.6 C)  TempSrc:  Oral  SpO2:  100%  Weight: 104.3 kg   Height: 5\' 8"  (1.727 m)    We will place the patient in a knee immobilizer and provide crutches and NSAIDs.  She was advised she needs to follow-up with her orthopedist for definitive care.  PROCEDURES    ED DIAGNOSES     ICD-10-CM   1. Chronic pain of right knee M25.561    G89.29        Hally Colella, Jonny Ruiz, MD 09/27/18 2359

## 2018-09-28 DIAGNOSIS — M25561 Pain in right knee: Secondary | ICD-10-CM | POA: Diagnosis not present

## 2018-09-28 MED ORDER — NAPROXEN 375 MG PO TABS: ORAL_TABLET | ORAL | 0 refills | Status: DC

## 2018-09-28 NOTE — ED Notes (Signed)
Pt offered a wheelchair at d/c. Pt requested to ambulate with crutches.

## 2018-09-28 NOTE — ED Notes (Signed)
PMS intact before and after. Pt tolerated well. All questions answered. 

## 2018-10-05 ENCOUNTER — Other Ambulatory Visit: Payer: Self-pay | Admitting: Family Medicine

## 2018-10-12 ENCOUNTER — Ambulatory Visit (INDEPENDENT_AMBULATORY_CARE_PROVIDER_SITE_OTHER): Payer: Medicare HMO | Admitting: Family Medicine

## 2018-10-12 ENCOUNTER — Encounter: Payer: Self-pay | Admitting: Family Medicine

## 2018-10-12 VITALS — BP 109/77 | HR 106 | Ht 66.0 in | Wt 210.0 lb

## 2018-10-12 DIAGNOSIS — G8929 Other chronic pain: Secondary | ICD-10-CM

## 2018-10-12 DIAGNOSIS — M25561 Pain in right knee: Secondary | ICD-10-CM

## 2018-10-12 NOTE — Patient Instructions (Signed)
The cymbalta should help with this as you titrate the medicine up. Continue your strengthening exercises especially straight leg raises, knee extensions, hip side raises 3 sets of 10 once a day. Add ankle weight if these become too easy. Arch supports typically help with this - something like dr. Jari Sportsman active series, spencos, superfeet. Knee brace (Shield's brace) typically helps as well. Do motion exercises before you get up from a seated position or lying down. Use walker to help get around until you build more strength in this leg. Consider topical aspercreme up to 4 times a day, salon pas patches. Tylenol and aleve can help as needed but are unlikely to make this better quicker. Consider pain management referral if this persists. I would not recommend additional surgical treatment for this - I know you continue to struggle with this but it's unlikely to help you and may make the pain, giving out worse. Keep regular follow up with Dr. Bufford Buttner and follow up with me as needed.

## 2018-10-13 ENCOUNTER — Encounter: Payer: Self-pay | Admitting: Family Medicine

## 2018-10-13 NOTE — Progress Notes (Signed)
PCP: Arliss Journey, PA-C  Subjective:   HPI: Patient is a 28 y.o. female here for right knee pain.  Patient here with her mother who also helped provide history. They reports patient has had problems with her right knee dating back to 2014. Recalls seeing Dr. 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 She had failed extensive physical therapy and home exercises prior to this surgery. That surgery was complicated by possible wound infection - had additional procedure on 4/10 with 3 compartment synovectomy, washout She had a left knee lateral release 06/10/13 complicated also by possible wound infection - had synovectomy on 11/10, irrigation and debridement Next note indicating right knee pain was in 06/2015 - saw Dr. Corinna Capra leading up to 09/14/2015 surgery; had tried physical therapy, injection, had MRI that did not show internal derangement - surgery was limited synovectomy suprapatellar pouch this day; had similar washout on 2/21 for concern of possible infection. Saw Dr. Linnell Fulling with [facility] on 11/14/16 - dx tear medial meniscus from recent MRI, mild patellofemoral OA and encouraged to do home exercise program, given meloxicam On 01/24/17 she had debridement of partial medial meniscus tear, lateral release, and major synovectomy 05/2017 knee gave out in bathroom - saw ortho and had MRI - postsurgical changes - poss free tear of lat meniscus - rec PT, injection but she declined Repeat MRI 10/24/17 - mod effusion, signal in lateral meniscus, tendinosis patellar tendon, chondrosis of lat compartment and patellofemoral compartment - encouraged PT, patient declined 01/23/18 saw Dr. Earma Reading - dx patellar instability - rec physical therapy, weight loss, no surgical intervention 06/22/18 saw Dr. Denyse Amass with c/o 1 month knee popping, severe pain - dx patellofemoral syndrome - diclofenac gel, HEP 11/6 had  injection, plan MRI, PT MRI 07/13/18 with patellar tendinosis, mild edema Hoffa's fat pad, patellar chondromalacia - planned referral to ortho Saw Dr. Santo Held with ortho 12/4 and recommended conservative treatment. She reports pain at 10/10 level and sharp diffusely anterior knee. Associated instability - some improvement with knee immobilizer. Takes hydrocodone which helps as well. Has a walker that she uses. Reports swelling.  No skin changes.  Past Medical History:  Diagnosis Date  . Acute thrombosis of right cephalic vein   . ADHD   . Asthma   . Autism   . Depression   . Esophagitis   . GERD (gastroesophageal reflux disease)   . Thyroid disease   . Ulcerative colitis (HCC)     Current Outpatient Medications on File Prior to Visit  Medication Sig Dispense Refill  . albuterol (PROVENTIL HFA;VENTOLIN HFA) 108 (90 Base) MCG/ACT inhaler Inhale 1-2 puffs into the lungs every 6 (six) hours as needed for wheezing or shortness of breath. 1 Inhaler 0  . ARIPiprazole (ABILIFY) 5 MG tablet Take 5 mg by mouth daily.    . baclofen (LIORESAL) 10 MG tablet Take 1 tablet (10 mg total) by mouth 3 (three) times daily as needed for muscle spasms. 90 each 2  . Botulinum Toxin Type A (BOTOX) 200 units SOLR Inject as directed every 3 (three) months.     . budesonide-formoterol (SYMBICORT) 160-4.5 MCG/ACT inhaler Inhale 2 puffs into the lungs 2 (two) times daily as needed (asthma).     . diclofenac sodium (VOLTAREN) 1 % GEL Apply 4 g topically 4 (four) times daily. To affected joint. 100 g 11  . dicyclomine (BENTYL) 10 MG capsule Take 10 mg by mouth 4 (  four) times daily as needed for spasms.     Marland Kitchen EPINEPHrine 0.3 mg/0.3 mL IJ SOAJ injection Inject into the muscle once.    . gabapentin (NEURONTIN) 300 MG capsule TAKE 3 CAPSULES BY MOUTH THREE TIMES DAILY 810 capsule 1  . HYDROcodone-acetaminophen (NORCO/VICODIN) 5-325 MG tablet Take 1 tablet by mouth every 6 (six) hours as needed (pain). 10 tablet 0  .  levothyroxine (SYNTHROID, LEVOTHROID) 100 MCG tablet Take 100 mcg by mouth daily before breakfast.     . mesalamine (APRISO) 0.375 G 24 hr capsule Take 375 mg by mouth 4 (four) times daily.    . metoCLOPramide (REGLAN) 10 MG tablet Take 1 tablet (10 mg total) by mouth every 8 (eight) hours as needed (for nausea). 10 tablet 0  . naproxen (NAPROSYN) 375 MG tablet Take 1 tablet twice daily as needed for knee pain. 20 tablet 0  . norgestimate-ethinyl estradiol (ORTHO-CYCLEN,SPRINTEC,PREVIFEM) 0.25-35 MG-MCG tablet Take 1 tablet by mouth daily.    . ondansetron (ZOFRAN-ODT) 4 MG disintegrating tablet Take 4 mg by mouth every 8 (eight) hours as needed for nausea.    Marland Kitchen oxyCODONE-acetaminophen (PERCOCET) 10-325 MG tablet Take 1 tablet by mouth every 6 (six) hours as needed for pain.    . pantoprazole (PROTONIX) 40 MG tablet Take 40 mg by mouth 2 (two) times daily.    . promethazine (PHENERGAN) 25 MG tablet Take 25 mg by mouth every 6 (six) hours as needed for nausea.    . sertraline (ZOLOFT) 50 MG tablet Take 50 mg by mouth daily.    . sucralfate (CARAFATE) 1 GM/10ML suspension Take 10 mLs (1 g total) by mouth 4 (four) times daily. 420 mL 1  . topiramate (TOPAMAX) 100 MG tablet Take 100 mg by mouth at bedtime.      No current facility-administered medications on file prior to visit.     Past Surgical History:  Procedure Laterality Date  . KNEE ARTHROSCOPY    . LUMBAR LAMINECTOMY/DECOMPRESSION MICRODISCECTOMY Right 05/28/2018   Procedure: Right Lumbar Five Sacral One Microdiscectomy;  Surgeon: Coletta Memos, MD;  Location: [facility] OR;  Service: Neurosurgery;  Laterality: Right;  Right Lumbar Five Sacral One Microdiscectomy    Allergies  Allergen Reactions  . Other Nausea And Vomiting and Swelling    PRETZELS > ANGIOEDEMA, SWELLING OF TONGUE    Pt reports experienced n/v with epidural steroid injection - unknown name (allergies would not title as "unknown") Pretzel, Epidural steroid  injection Pretzels causes tongue swelling Tongue swelling- Pretzels  . Bee Pollen Swelling    Angioedema   . Capsaicin Hives and Rash  . Bee Venom     UNSPECIFIED REACTION   . Eggs Or Egg-Derived Products Other (See Comments)    Egg whites "makes the inside of my face burn"  [Can take flu shot]  . Lac Bovis Other (See Comments)    "makes the inside of my face burn"     Social History   Socioeconomic History  . Marital status: Single    Spouse name: Not on file  . Number of children: Not on file  . Years of education: Not on file  . Highest education level: Not on file  Occupational History  . Not on file  Social Needs  . Financial resource strain: Not on file  . Food insecurity:    Worry: Not on file    Inability: Not on file  . Transportation needs:    Medical: Not on file    Non-medical: Not on  file  Tobacco Use  . Smoking status: Never Smoker  . Smokeless tobacco: Never Used  Substance and Sexual Activity  . Alcohol use: Yes    Comment: wine ocassionally  . Drug use: No  . Sexual activity: Not on file  Lifestyle  . Physical activity:    Days per week: Not on file    Minutes per session: Not on file  . Stress: Not on file  Relationships  . Social connections:    Talks on phone: Not on file    Gets together: Not on file    Attends religious service: Not on file    Active member of club or organization: Not on file    Attends meetings of clubs or organizations: Not on file    Relationship status: Not on file  . Intimate partner violence:    Fear of current or ex partner: Not on file    Emotionally abused: Not on file    Physically abused: Not on file    Forced sexual activity: Not on file  Other Topics Concern  . Not on file  Social History Narrative  . Not on file    Family History  Problem Relation Age of Onset  . Hyperlipidemia Mother   . Thyroid disease Mother   . Diabetes Mother   . Diabetes Father     BP 109/77   Pulse (!) 106   Ht 5'  6" (1.676 m)   Wt 210 lb (95.3 kg)   BMI 33.89 kg/m   Review of Systems: See HPI above.     Objective:  Physical Exam:  Gen: NAD, comfortable in exam room  Right knee: Multiple surgical scars.  No  other gross deformity, ecchymoses, effusion. TTP diffusely including both joint lines and post patellar facets. FROM with 5/5 strength flexion and extension. Negative ant/post drawers. Negative valgus/varus testing. Negative lachmans. Pain with mcmurrays, apleys, and patellar apprehension. NV intact distally.  Left knee: Well healed surgical scars.  No other deformity. FROM with 5/5 strength. No tenderness to palpation. NVI distally.   Assessment & Plan:  1. Chronic right knee pain - patient has had 7 surgeries on this knee including 3 lateral releases, 4 synovectomies, 1 partial medial meniscectomy.  Continues to struggle with pain and instability of this knee.  She's done physical therapy, tried bracing, injections without any longstanding relief.  Think we do need to consider somatization disorder given multiple pain complaints (migraine, back, knees, dysphagia), anxiety and concern about seriousness of condition despite relatively normal workup.  I strongly discouraged her from pursuing additional surgical intervention.  Continue cymbalta.  Shown home exercises to focus on for patellofemoral syndrome, bracing, arch supports.  Use walker for stability.  Tylenol, topical medications.

## 2018-11-02 ENCOUNTER — Telehealth: Payer: Self-pay

## 2018-11-02 NOTE — Telephone Encounter (Signed)
Jasmine Hickman left a message with the [facility] about right knee locking up. I asked her what the Orthopedic recommended. She states she has never seen the Orthopedic. She states the right knee locks up randomly for a couple of hours. Please advise.

## 2018-11-03 ENCOUNTER — Emergency Department
Admission: EM | Admit: 2018-11-03 | Discharge: 2018-11-03 | Discharge status: Absent without leave | Payer: Medicare HMO | Source: Home / Self Care

## 2018-11-03 ENCOUNTER — Ambulatory Visit (INDEPENDENT_AMBULATORY_CARE_PROVIDER_SITE_OTHER): Payer: Medicare HMO | Admitting: Family Medicine

## 2018-11-03 ENCOUNTER — Encounter: Payer: Self-pay | Admitting: Family Medicine

## 2018-11-03 VITALS — BP 132/84 | HR 79

## 2018-11-03 DIAGNOSIS — M25561 Pain in right knee: Secondary | ICD-10-CM

## 2018-11-03 DIAGNOSIS — Q682 Congenital deformity of knee: Secondary | ICD-10-CM

## 2018-11-03 DIAGNOSIS — S83001A Unspecified subluxation of right patella, initial encounter: Secondary | ICD-10-CM | POA: Diagnosis not present

## 2018-11-03 NOTE — Patient Instructions (Signed)
Thank you for coming in today. You should hear about repeat MRI soon.  Attend physical therapy.  Recheck with me after MRI.   Work on Systems developer.  Walk with a walker if you can.  Do the straight leg raises in bed.

## 2018-11-03 NOTE — Telephone Encounter (Signed)
Left pt msg advising to schedule appt with Dr Denyse Amass

## 2018-11-03 NOTE — Telephone Encounter (Signed)
Sounds like patient needs a follow-up appointment with me.

## 2018-11-03 NOTE — Progress Notes (Signed)
Jasmine Hickman is a 28 y.o. female who presents to [facility] today for right knee pain and weakness.  Jasmine Hickman has had a persistent problem of right leg pain and weakness.  She has been seen by me several times by several other providers in the system and by multiple other providers outside the [facility] health system for knee pain.  She notes persistent anterior knee pain associated with knee weakness and giving way.  She notes multiple falls.  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  She has been treated with knee immobilizer injections a walker and ultimately is in a wheelchair most of the time.  She is had multiple different x-rays most recently March 3 which did not show any acute fracture or severe degenerative changes.  She has not had much physical therapy for this issue yet.  She notes that she is very embarrassed to go to physical therapy.  Is quite bothered by these problems and notes that it is basically disabling.  To give again difficulty standing.    ROS:  As above  Exam:  BP 132/84   Pulse 79  Wt Readings from Last 5 Encounters:  10/12/18 210 lb (95.3 kg)  09/27/18 230 lb (104.3 kg)  07/27/18 293 lb (132.9 kg)  07/15/18 291 lb (132 kg)  07/06/18 299 lb (135.6 kg)   General: He did in wheelchair.  Tearful.  Nontoxic appearing Neuro/Psych: Alert and oriented x3, extra-ocular muscles intact, able to move all 4 extremities, sensation grossly intact. Skin: Warm and dry, no rashes noted.  Respiratory: Not using accessory muscles, speaking in full sentences, trachea midline.  Cardiovascular: Pulses palpable, no extremity edema. Abdomen: Does not appear distended. MSK: Right leg: Decreased quadricep bulk.  Right knee normal-appearing diffusely tender to palpation throughout knee.  Significant decreased hip flexion and knee extension strength. Significant pain with  range of motion.  Stable ligamentous exam.  Negative patellar apprehension test.    Lab and Radiology Results EXAM: MRI OF THE RIGHT KNEE WITHOUT CONTRAST  TECHNIQUE: Multiplanar, multisequence MR imaging of the knee was performed. No intravenous contrast was administered.  COMPARISON:  Radiographs 06/22/2018.  No previous MRI.  FINDINGS: Despite efforts by the technologist and patient, mild motion artifact is present on today's exam and could not be eliminated. This reduces exam sensitivity and specificity.  MENISCI  Medial meniscus: Mild free edge blunting of the meniscal body on the coronal images, possibly secondary to previous partial meniscectomy. No recurrent meniscal tear or displaced meniscal fragment identified.  Lateral meniscus:  Intact with normal morphology.  LIGAMENTS  Cruciates:  Intact.  Collaterals:  Intact.  CARTILAGE  Patellofemoral: Evaluation of the patellar cartilage is limited by artifact on the axial images. There is mild patellar chondral thinning with subchondral cyst formation in the inferior pole. No focal chondral defect identified.  Medial:  Mild chondral thinning without focal defect.  Lateral: Mild chondral thinning with peripheral osteophyte formation. No focal defect.  MISCELLANEOUS  Joint:  Small joint effusion, primarily in the lateral gutter.  Popliteal Fossa:  Unremarkable. No significant Baker's cyst.  Extensor Mechanism: There is a mild patella alta. The distal quadriceps tendon appears normal. There is thickening of the proximal patellar tendon without T2 signal abnormality. There are probable postsurgical changes from lateral patellar retinacular release. There is mild edema superolaterally in Hoffa's fat.  Bones: No acute or significant extra-articular osseous findings. No signs  of recent transient patellar dislocation injury.  Other: No other significant periarticular soft tissue  findings.  IMPRESSION: 1. Patella alta with proximal patellar tendinosis and postsurgical changes suggesting previous lateral retinacular release. There is mild edema superolaterally in Hoffa's fat and mild patellar chondromalacia inferiorly. No signs of recent transient patellar dislocation injury or other acute osseous findings. 2. Mild free edge blunting of the medial meniscal body, possibly secondary to previous partial meniscectomy. No evidence of recurrent meniscal tear. 3. Intact knee ligaments.   Electronically Signed   By: Carey Bullocks M.D.   On: 07/13/2018 10:53 I personally (independently) visualized and performed the interpretation of the images attached in this note.     Assessment and Plan: 28 y.o. female with knee pain and weakness.  Patient has had multiple falls and has significant disabling knee pain and leg weakness.  I think it is very likely that the majority of her pain is patellofemoral in nature and the reason that she has so much pain and is falling so much is because she has had significant quadricep weakness due to atrophy due to nonuse because of pain.  She is effectively stuck and a vicious cycle of pain and weakness that is worsening her symptoms.  I believe the most likely therapy option to get her better is very significant dedicated formal physical therapy.  However she has significant barriers to achieving these goals.  However she has had multiple falls and certainly could have internal derangements not seen on the most recent x-ray on March 3.  It is possible that she suffered further ligamentous injury and would be unable to improve with physical therapy.  Therefore think it is reasonable to repeat knee MRI.  Her most recent MRI was from November 2019 as noted above.  Recheck after MRI.   PDMP not reviewed this encounter. Orders Placed This Encounter  Procedures  . MR Knee Right Wo Contrast    Standing Status:   Future    Standing Expiration  Date:   01/03/2020    Order Specific Question:   What is the patient's sedation requirement?    Answer:   No Sedation    Order Specific Question:   Does the patient have a pacemaker or implanted devices?    Answer:   No    Order Specific Question:   Preferred imaging location?    Answer:   [facility] (table limit-350lbs)    Order Specific Question:   Radiology Contrast Protocol - do NOT remove file path    Answer:   [email or web address]  . Ambulatory referral to Physical Therapy    Referral Priority:   Routine    Referral Type:   Physical Medicine    Referral Reason:   Specialty Services Required    Requested Specialty:   Physical Therapy   No orders of the defined types were placed in this encounter.   Historical information moved to improve visibility of documentation.  Past Medical History:  Diagnosis Date  . Acute thrombosis of right cephalic vein   . ADHD   . Asthma   . Autism   . Depression   . Esophagitis   . GERD (gastroesophageal reflux disease)   . Thyroid disease   . Ulcerative colitis [facility])    Past Surgical History:  Procedure Laterality Date  . KNEE ARTHROSCOPY    . LUMBAR LAMINECTOMY/DECOMPRESSION MICRODISCECTOMY Right 05/28/2018   Procedure: Right Lumbar Five Sacral One Microdiscectomy;  Surgeon: Coletta Memos, MD;  Location: [facility] OR;  Service:  Neurosurgery;  Laterality: Right;  Right Lumbar Five Sacral One Microdiscectomy   Social History   Tobacco Use  . Smoking status: Never Smoker  . Smokeless tobacco: Never Used  Substance Use Topics  . Alcohol use: Yes    Comment: wine ocassionally   family history includes Diabetes in her father and mother; Hyperlipidemia in her mother; Thyroid disease in her mother.  Medications: Current Outpatient Medications  Medication Sig Dispense Refill  . albuterol (PROVENTIL HFA;VENTOLIN HFA) 108 (90 Base) MCG/ACT inhaler Inhale 1-2 puffs into the lungs every 6 (six) hours as needed for  wheezing or shortness of breath. 1 Inhaler 0  . ARIPiprazole (ABILIFY) 5 MG tablet Take 5 mg by mouth daily.    . baclofen (LIORESAL) 10 MG tablet Take 1 tablet (10 mg total) by mouth 3 (three) times daily as needed for muscle spasms. 90 each 2  . Botulinum Toxin Type A (BOTOX) 200 units SOLR Inject as directed every 3 (three) months.     . budesonide-formoterol (SYMBICORT) 160-4.5 MCG/ACT inhaler Inhale 2 puffs into the lungs 2 (two) times daily as needed (asthma).     . diclofenac sodium (VOLTAREN) 1 % GEL Apply 4 g topically 4 (four) times daily. To affected joint. 100 g 11  . dicyclomine (BENTYL) 10 MG capsule Take 10 mg by mouth 4 (four) times daily as needed for spasms.     Marland Kitchen EPINEPHrine 0.3 mg/0.3 mL IJ SOAJ injection Inject into the muscle once.    . gabapentin (NEURONTIN) 300 MG capsule TAKE 3 CAPSULES BY MOUTH THREE TIMES DAILY 810 capsule 1  . HYDROcodone-acetaminophen (NORCO/VICODIN) 5-325 MG tablet Take 1 tablet by mouth every 6 (six) hours as needed (pain). 10 tablet 0  . levothyroxine (SYNTHROID, LEVOTHROID) 100 MCG tablet Take 100 mcg by mouth daily before breakfast.     . mesalamine (APRISO) 0.375 G 24 hr capsule Take 375 mg by mouth 4 (four) times daily.    . metoCLOPramide (REGLAN) 10 MG tablet Take 1 tablet (10 mg total) by mouth every 8 (eight) hours as needed (for nausea). 10 tablet 0  . naproxen (NAPROSYN) 375 MG tablet Take 1 tablet twice daily as needed for knee pain. 20 tablet 0  . norgestimate-ethinyl estradiol (ORTHO-CYCLEN,SPRINTEC,PREVIFEM) 0.25-35 MG-MCG tablet Take 1 tablet by mouth daily.    . ondansetron (ZOFRAN-ODT) 4 MG disintegrating tablet Take 4 mg by mouth every 8 (eight) hours as needed for nausea.    Marland Kitchen oxyCODONE-acetaminophen (PERCOCET) 10-325 MG tablet Take 1 tablet by mouth every 6 (six) hours as needed for pain.    . pantoprazole (PROTONIX) 40 MG tablet Take 40 mg by mouth 2 (two) times daily.    . promethazine (PHENERGAN) 25 MG tablet Take 25 mg by  mouth every 6 (six) hours as needed for nausea.    . sertraline (ZOLOFT) 50 MG tablet Take 50 mg by mouth daily.    . sucralfate (CARAFATE) 1 GM/10ML suspension Take 10 mLs (1 g total) by mouth 4 (four) times daily. 420 mL 1  . topiramate (TOPAMAX) 100 MG tablet Take 100 mg by mouth at bedtime.      No current facility-administered medications for this visit.    Allergies  Allergen Reactions  . Other Nausea And Vomiting and Swelling    PRETZELS > ANGIOEDEMA, SWELLING OF TONGUE    Pt reports experienced n/v with epidural steroid injection - unknown name (allergies would not title as "unknown") Pretzel, Epidural steroid injection Pretzels causes tongue swelling Tongue swelling- Pretzels  .  Bee Pollen Swelling    Angioedema   . Capsaicin Hives and Rash  . Bee Venom     UNSPECIFIED REACTION   . Eggs Or Egg-Derived Products Other (See Comments)    Egg whites "makes the inside of my face burn"  [Can take flu shot]  . Lac Bovis Other (See Comments)    "makes the inside of my face burn"       Discussed warning signs or symptoms. Please see discharge instructions. Patient expresses understanding.

## 2018-11-04 ENCOUNTER — Telehealth: Payer: Self-pay

## 2018-11-04 NOTE — Telephone Encounter (Signed)
Jasmine Hickman mom called and states Jasmine Hickman has done physical therapy without improvement. She asked if we needed her to take a picture of her falling so to show proof she is falling. I advised that would not be necessary and that I was sure Dr Denyse Amass believes she is falling due to a knee problem. I advised we would have more information once the MRI is complete. I told her I understand this is difficult and that she needs to stay safe by using the walker as much as possible.

## 2018-11-04 NOTE — Telephone Encounter (Signed)
Will await results of MRI however unless we see something different there is not going to be another treatment option in my opinion besides real dedicated formal physical therapy.  At this point actually is extremely weak and is unlikely to get better without dedicated physical therapy attempt.  However we will make sure that there is no other injuries inside the knee with MRI.

## 2018-11-08 ENCOUNTER — Encounter (HOSPITAL_BASED_OUTPATIENT_CLINIC_OR_DEPARTMENT_OTHER): Payer: Self-pay | Admitting: *Deleted

## 2018-11-08 ENCOUNTER — Other Ambulatory Visit: Payer: Self-pay

## 2018-11-08 ENCOUNTER — Emergency Department (HOSPITAL_BASED_OUTPATIENT_CLINIC_OR_DEPARTMENT_OTHER)
Admission: EM | Admit: 2018-11-08 | Discharge: 2018-11-09 | Disposition: A | Discharge status: Home / Self Care | Payer: Medicare HMO | Attending: Emergency Medicine | Admitting: Emergency Medicine

## 2018-11-08 ENCOUNTER — Emergency Department (HOSPITAL_BASED_OUTPATIENT_CLINIC_OR_DEPARTMENT_OTHER): Payer: Medicare HMO

## 2018-11-08 DIAGNOSIS — F840 Autistic disorder: Secondary | ICD-10-CM | POA: Insufficient documentation

## 2018-11-08 DIAGNOSIS — M25561 Pain in right knee: Secondary | ICD-10-CM | POA: Diagnosis not present

## 2018-11-08 DIAGNOSIS — G8929 Other chronic pain: Secondary | ICD-10-CM | POA: Diagnosis not present

## 2018-11-08 DIAGNOSIS — E039 Hypothyroidism, unspecified: Secondary | ICD-10-CM | POA: Insufficient documentation

## 2018-11-08 DIAGNOSIS — Z79899 Other long term (current) drug therapy: Secondary | ICD-10-CM | POA: Insufficient documentation

## 2018-11-08 DIAGNOSIS — J45909 Unspecified asthma, uncomplicated: Secondary | ICD-10-CM | POA: Diagnosis not present

## 2018-11-08 MED ORDER — NAPROXEN 250 MG PO TABS
500.0000 mg | ORAL_TABLET | Freq: Once | ORAL | Status: AC
  Administered 2018-11-08: 500 mg via ORAL
  Filled 2018-11-08: qty 2

## 2018-11-08 MED ORDER — LIDOCAINE 5 % EX PTCH: 1.0000 | MEDICATED_PATCH | CUTANEOUS | 0 refills | Status: DC

## 2018-11-08 NOTE — ED Notes (Signed)
ED Provider at bedside. 

## 2018-11-08 NOTE — ED Notes (Signed)
Patient transported to X-ray 
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
## 2018-11-08 NOTE — ED Triage Notes (Addendum)
Pt reports she has been having problems with her right knee "locking up". States she fell today and has had multiple falls. States she has a wheelchair but she is "embarrassed to use it"

## 2018-11-09 ENCOUNTER — Ambulatory Visit (INDEPENDENT_AMBULATORY_CARE_PROVIDER_SITE_OTHER): Payer: Medicare HMO

## 2018-11-09 DIAGNOSIS — X58XXXD Exposure to other specified factors, subsequent encounter: Secondary | ICD-10-CM | POA: Diagnosis not present

## 2018-11-09 DIAGNOSIS — Q682 Congenital deformity of knee: Secondary | ICD-10-CM | POA: Diagnosis not present

## 2018-11-09 DIAGNOSIS — M25561 Pain in right knee: Secondary | ICD-10-CM | POA: Diagnosis not present

## 2018-11-09 DIAGNOSIS — S83001D Unspecified subluxation of right patella, subsequent encounter: Secondary | ICD-10-CM

## 2018-11-09 DIAGNOSIS — S83001A Unspecified subluxation of right patella, initial encounter: Secondary | ICD-10-CM

## 2018-11-09 NOTE — ED Notes (Signed)
Pt and mother understood dc material. NAD noted. Script sent in electronically. Pt and mother escorted to check out window. All questions answered to satisfaction.

## 2018-11-09 NOTE — ED Notes (Signed)
PMS intact before and after. Pt tolerated well. All questions answered. 

## 2018-11-10 ENCOUNTER — Ambulatory Visit: Payer: Self-pay | Admitting: Family Medicine

## 2018-11-10 ENCOUNTER — Telehealth: Payer: Self-pay | Admitting: Family Medicine

## 2018-11-10 ENCOUNTER — Encounter: Payer: Self-pay | Admitting: Family Medicine

## 2018-11-10 NOTE — Telephone Encounter (Signed)
Please see her chart, she was discharged from practice earlier today.

## 2018-11-10 NOTE — Telephone Encounter (Signed)
Recommend going back to [facility]. Also could be seen at [facility] orthopedics in [place] or at [facility] in [place].

## 2018-11-10 NOTE — Telephone Encounter (Signed)
-----   Message from Normand Sloop, LPN sent at 12/02/8117  3:34 PM EDT ----- Spoke with mother and Myrah is in a lot of pain and fell again today. She wants her to see a ortho surgeon for this and would like to go to one that you recommend. KG LPN

## 2018-11-10 NOTE — Telephone Encounter (Signed)
Called Pt regarding MyChart message. She was unavailable - mother reports she was sleeping. I advised mother we no longer will be able to provide care for Patient based on hostility and verbiage used in her message today to treating Provider. Mother reports she read the message before it was sent, and understands why we are no longer going to be able to treat her. Advised Pt's mother no referral is needed for orthopedic surgery, so if Pt feels that is her best option to call and schedule an evaluation appointment. If Pt is experiencing skin discoloration along with "excrutiating pain" then she should seek emergent care ASAP. Mother verbalized understanding.   Will route to treating Physician for official letter of dismissal to be written. Did update chart to show she has been dismissed from [facility].

## 2019-03-29 ENCOUNTER — Other Ambulatory Visit: Payer: Self-pay | Admitting: Family Medicine

## 2020-08-12 DIAGNOSIS — I2699 Other pulmonary embolism without acute cor pulmonale: Secondary | ICD-10-CM

## 2020-08-12 HISTORY — DX: Other pulmonary embolism without acute cor pulmonale: I26.99

## 2020-08-26 HISTORY — PX: KNEE SURGERY: SHX244

## 2021-08-26 DIAGNOSIS — I2699 Other pulmonary embolism without acute cor pulmonale: Secondary | ICD-10-CM

## 2021-08-26 HISTORY — DX: Other pulmonary embolism without acute cor pulmonale: I26.99

## 2022-05-06 ENCOUNTER — Other Ambulatory Visit: Payer: Self-pay

## 2022-05-06 ENCOUNTER — Encounter (HOSPITAL_COMMUNITY): Payer: Self-pay | Admitting: *Deleted

## 2022-05-06 ENCOUNTER — Emergency Department (HOSPITAL_COMMUNITY)
Admission: EM | Admit: 2022-05-06 | Discharge: 2022-05-06 | Discharge status: Against Medical Advice | Payer: Medicare HMO | Attending: Emergency Medicine | Admitting: Emergency Medicine

## 2022-05-06 ENCOUNTER — Emergency Department (HOSPITAL_COMMUNITY): Payer: Medicare HMO

## 2022-05-06 DIAGNOSIS — M79604 Pain in right leg: Secondary | ICD-10-CM | POA: Diagnosis not present

## 2022-05-06 DIAGNOSIS — M25511 Pain in right shoulder: Secondary | ICD-10-CM | POA: Diagnosis not present

## 2022-05-06 DIAGNOSIS — Z5321 Procedure and treatment not carried out due to patient leaving prior to being seen by health care provider: Secondary | ICD-10-CM | POA: Diagnosis not present

## 2022-05-06 DIAGNOSIS — R200 Anesthesia of skin: Secondary | ICD-10-CM | POA: Insufficient documentation

## 2022-05-06 DIAGNOSIS — M545 Low back pain, unspecified: Secondary | ICD-10-CM | POA: Diagnosis present

## 2022-05-06 LAB — I-STAT BETA HCG BLOOD, ED (MC, WL, AP ONLY): I-stat hCG, quantitative: <5 m[IU]/mL (ref <5)
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
## 2022-05-06 NOTE — ED Triage Notes (Signed)
C/o back pain and numbness in both shoulders down to her  legs and feet, unable to sleep onset 2 years , pain worse today. Though she had an appointment with Dr. Mikal Plane today however it is next week.

## 2023-01-06 ENCOUNTER — Other Ambulatory Visit: Payer: Self-pay | Admitting: Neurosurgery

## 2023-01-10 HISTORY — PX: ESOPHAGUS SURGERY: SHX626

## 2023-01-14 NOTE — Pre-Procedure Instructions (Signed)
Surgical Instructions    Your procedure is scheduled on Jan 22, 2023.  Report to [facility] Main Entrance "A" at 10:15 A.M., then check in with the Admitting office.  Call this number if you have problems the morning of surgery:  [phone]  If you have any questions prior to your surgery date call [phone]: Open Monday-Friday 8am-4pm If you experience any cold or flu symptoms such as cough, fever, chills, shortness of breath, etc. between now and your scheduled surgery, please notify us at the above number.     Remember:  Do not eat after midnight the night before your surgery  You may drink clear liquids until 9:15 AM the morning of your surgery.   Clear liquids allowed are: Water, Non-Citrus Juices (without pulp), Carbonated Beverages, Clear Tea, Black Coffee Only (NO MILK, CREAM OR POWDERED CREAMER of any kind), and Gatorade.     Take these medicines the morning of surgery with A SIP OF WATER:  budesonide-formoterol (SYMBICORT) inhaler   dicyclomine (BENTYL)   levothyroxine (SYNTHROID)   montelukast (SINGULAIR)   norethindrone (MICRONOR)   pantoprazole (PROTONIX)   sertraline (ZOLOFT)    May take these medicines IF NEEDED:  albuterol (PROVENTIL HFA;VENTOLIN HFA) inhaler   hydrOXYzine (VISTARIL)     As of today, STOP taking any Aspirin (unless otherwise instructed by your surgeon) Aleve, Naproxen, Ibuprofen, Motrin, Advil, Goody's, BC's, all herbal medications, fish oil, and all vitamins.                     Do NOT Smoke (Tobacco/Vaping) for 24 hours prior to your procedure.  If you use a CPAP at night, you may bring your mask/headgear for your overnight stay.   Contacts, glasses, piercing's, hearing aid's, dentures or partials may not be worn into surgery, please bring cases for these belongings.    For patients admitted to the hospital, discharge time will be determined by your treatment team.   Patients discharged the day of surgery will not be allowed to drive  home, and someone needs to stay with them for 24 hours.  SURGICAL WAITING ROOM VISITATION Patients having surgery or a procedure may have no more than 2 support people in the waiting area - these visitors may rotate.   Children under the age of 38 must have an adult with them who is not the patient. If the patient needs to stay at the hospital during part of their recovery, the visitor guidelines for inpatient rooms apply. Pre-op nurse will coordinate an appropriate time for 1 support person to accompany patient in pre-op.  This support person may not rotate.   Please refer to the [facility] website for the visitor guidelines for Inpatients (after your surgery is over and you are in a regular room).    If you received a COVID test during your pre-op visit  it is requested that you wear a mask when out in public, stay away from anyone that may not be feeling well and notify your surgeon if you develop symptoms. If you have been in contact with anyone that has tested positive in the last 10 days please notify you surgeon.    Pre-operative 5 CHG Bath Instructions   You can play a key role in reducing the risk of infection after surgery. Your skin needs to be as free of germs as possible. You can reduce the number of germs on your skin by washing with CHG (chlorhexidine gluconate) soap before surgery. CHG is an antiseptic soap  that kills germs and continues to kill germs even after washing.   DO NOT use if you have an allergy to chlorhexidine/CHG or antibacterial soaps. If your skin becomes reddened or irritated, stop using the CHG and notify one of our RNs at [phone].   Please shower with the CHG soap starting 4 days before surgery using the following schedule:     Please keep in mind the following:  DO NOT shave, including legs and underarms, starting the day of your first shower.   You may shave your face at any point before/day of surgery.  Place clean sheets on your bed the day you  start using CHG soap. Use a clean washcloth (not used since being washed) for each shower. DO NOT sleep with pets once you start using the CHG.   CHG Shower Instructions:  If you choose to wash your hair and private area, wash first with your normal shampoo/soap.  After you use shampoo/soap, rinse your hair and body thoroughly to remove shampoo/soap residue.  Turn the water OFF and apply about 3 tablespoons (45 ml) of CHG soap to a CLEAN washcloth.  Apply CHG soap ONLY FROM YOUR NECK DOWN TO YOUR TOES (washing for 3-5 minutes)  DO NOT use CHG soap on face, private areas, open wounds, or sores.  Pay special attention to the area where your surgery is being performed.  If you are having back surgery, having someone wash your back for you may be helpful. Wait 2 minutes after CHG soap is applied, then you may rinse off the CHG soap.  Pat dry with a clean towel  Put on clean clothes/pajamas   If you choose to wear lotion, please use ONLY the CHG-compatible lotions on the back of this paper.     Additional instructions for the day of surgery: DO NOT APPLY any lotions, deodorants, cologne, or perfumes.   Do not wear jewelry or makeup Do not bring valuables to the hospital. [facility] is not responsible for any belongings or valuables. Do not wear nail polish, gel polish, artificial nails, or any other type of covering on natural nails (fingers and toes) Put on clean/comfortable clothes.  Brush your teeth.  Ask your nurse before applying any prescription medications to the skin.      CHG Compatible Lotions   Aveeno Moisturizing lotion  Cetaphil Moisturizing Cream  Cetaphil Moisturizing Lotion  Clairol Herbal Essence Moisturizing Lotion, Dry Skin  Clairol Herbal Essence Moisturizing Lotion, Extra Dry Skin  Clairol Herbal Essence Moisturizing Lotion, Normal Skin  Curel Age Defying Therapeutic Moisturizing Lotion with Alpha Hydroxy  Curel Extreme Care Body Lotion  Curel Soothing Hands  Moisturizing Hand Lotion  Curel Therapeutic Moisturizing Cream, Fragrance-Free  Curel Therapeutic Moisturizing Lotion, Fragrance-Free  Curel Therapeutic Moisturizing Lotion, Original Formula  Eucerin Daily Replenishing Lotion  Eucerin Dry Skin Therapy Plus Alpha Hydroxy Crme  Eucerin Dry Skin Therapy Plus Alpha Hydroxy Lotion  Eucerin Original Crme  Eucerin Original Lotion  Eucerin Plus Crme Eucerin Plus Lotion  Eucerin TriLipid Replenishing Lotion  Keri Anti-Bacterial Hand Lotion  Keri Deep Conditioning Original Lotion Dry Skin Formula Softly Scented  Keri Deep Conditioning Original Lotion, Fragrance Free Sensitive Skin Formula  Keri Lotion Fast Absorbing Fragrance Free Sensitive Skin Formula  Keri Lotion Fast Absorbing Softly Scented Dry Skin Formula  Keri Original Lotion  Keri Skin Renewal Lotion Keri Silky Smooth Lotion  Keri Silky Smooth Sensitive Skin Lotion  Nivea Body Creamy Conditioning Oil  Nivea Body Extra Enriched Lotion  Nivea Body Original Lotion  Nivea Body Sheer Moisturizing Lotion Nivea Crme  Nivea Skin Firming Lotion  NutraDerm 30 Skin Lotion  NutraDerm Skin Lotion  NutraDerm Therapeutic Skin Cream  NutraDerm Therapeutic Skin Lotion  ProShield Protective Hand Cream  Provon moisturizing lotion   Please read over the following fact sheets that you were given.

## 2023-01-15 ENCOUNTER — Encounter (HOSPITAL_COMMUNITY)
Admission: RE | Admit: 2023-01-15 | Discharge: 2023-01-15 | Disposition: A | Discharge status: Home / Self Care | Payer: Medicare HMO | Source: Ambulatory Visit | Attending: Neurosurgery | Admitting: Neurosurgery

## 2023-01-15 ENCOUNTER — Encounter (HOSPITAL_COMMUNITY): Payer: Self-pay

## 2023-01-15 ENCOUNTER — Other Ambulatory Visit: Payer: Self-pay

## 2023-01-15 VITALS — BP 121/75 | HR 82 | Temp 98.9°F | Resp 20 | Ht 68.0 in | Wt 332.0 lb

## 2023-01-15 DIAGNOSIS — E039 Hypothyroidism, unspecified: Secondary | ICD-10-CM | POA: Insufficient documentation

## 2023-01-15 DIAGNOSIS — Z01812 Encounter for preprocedural laboratory examination: Secondary | ICD-10-CM | POA: Diagnosis present

## 2023-01-15 DIAGNOSIS — G4733 Obstructive sleep apnea (adult) (pediatric): Secondary | ICD-10-CM | POA: Diagnosis not present

## 2023-01-15 DIAGNOSIS — Z01818 Encounter for other preprocedural examination: Secondary | ICD-10-CM

## 2023-01-15 DIAGNOSIS — F840 Autistic disorder: Secondary | ICD-10-CM | POA: Insufficient documentation

## 2023-01-15 DIAGNOSIS — J45909 Unspecified asthma, uncomplicated: Secondary | ICD-10-CM | POA: Insufficient documentation

## 2023-01-15 DIAGNOSIS — F909 Attention-deficit hyperactivity disorder, unspecified type: Secondary | ICD-10-CM | POA: Diagnosis not present

## 2023-01-15 DIAGNOSIS — E669 Obesity, unspecified: Secondary | ICD-10-CM | POA: Diagnosis not present

## 2023-01-15 DIAGNOSIS — M5126 Other intervertebral disc displacement, lumbar region: Secondary | ICD-10-CM | POA: Diagnosis not present

## 2023-01-15 DIAGNOSIS — K219 Gastro-esophageal reflux disease without esophagitis: Secondary | ICD-10-CM | POA: Diagnosis not present

## 2023-01-15 HISTORY — DX: Headache, unspecified: R51.9

## 2023-01-15 HISTORY — DX: Sleep apnea, unspecified: G47.30

## 2023-01-15 HISTORY — DX: Anxiety disorder, unspecified: F41.9

## 2023-01-15 HISTORY — DX: Anemia, unspecified: D64.9

## 2023-01-15 HISTORY — DX: Hypothyroidism, unspecified: E03.9

## 2023-01-15 LAB — CBC
HCT: 38.5 % (ref 36.0–46.0)
Hemoglobin: 11.9 g/dL — ABNORMAL LOW (ref 12.0–15.0)
MCH: 28.1 pg (ref 26.0–34.0)
MCHC: 30.9 g/dL (ref 30.0–36.0)
MCV: 90.8 fL (ref 80.0–100.0)
Platelets: 226 10*3/uL (ref 150–400)
RBC: 4.24 MIL/uL (ref 3.87–5.11)
RDW: 13 % (ref 11.5–15.5)
WBC: 6.3 10*3/uL (ref 4.0–10.5)
nRBC: 0 % (ref 0.0–0.2)

## 2023-01-15 LAB — BASIC METABOLIC PANEL
Anion gap: 13 (ref 5–15)
BUN: 10 mg/dL (ref 6–20)
CO2: 24 mmol/L (ref 22–32)
Calcium: 9 mg/dL (ref 8.9–10.3)
Chloride: 102 mmol/L (ref 98–111)
Creatinine, Ser: 0.75 mg/dL (ref 0.44–1.00)
GFR, Estimated: >60 mL/min (ref >60)
Glucose, Bld: 99 mg/dL (ref 70–99)
Potassium: 3.4 mmol/L — ABNORMAL LOW (ref 3.5–5.1)
Sodium: 139 mmol/L (ref 135–145)

## 2023-01-15 LAB — SURGICAL PCR SCREEN
MRSA, PCR: NEGATIVE
Staphylococcus aureus: POSITIVE — AB

## 2023-01-15 NOTE — Progress Notes (Signed)
PCP - Gwendolyn Fill, PA Cardiologist - Denies  PPM/ICD - Denies Device Orders - n/a Rep Notified - n/a  Chest x-ray - 10/04/2022 (CE) EKG - 10/04/2022 (CE) Tracing requested Stress Test - Denies ECHO - Denies Cardiac Cath - Denies  Sleep Study - +OSA but pt unable to tolerate wearing CPAP  No DM  Last dose of GLP1 agonist- n/a GLP1 instructions: n/a  Blood Thinner Instructions: n/a Aspirin Instructions: n/a  ERAS Protcol - Clear liquids until 0915 morning of surgery PRE-SURGERY Ensure or G2- n/a  COVID TEST- n/a   Anesthesia review: Yes. EKG tracing requested from ED admission February 2024. Pt also recently had esophagus stretched Jan 10, 2023. She endorses having a PE in 2022 that required hospitalization related to left knee surgery that she had previously had. She was on Eliquis but is no longer on it.  Patient denies shortness of breath, fever, cough and chest pain at PAT appointment. Pt denies any respiratory illness/infection in the last two months.   All instructions explained to the patient, with a verbal understanding of the material. Patient agrees to go over the instructions while at home for a better understanding. Patient also instructed to self quarantine after being tested for COVID-19. The opportunity to ask questions was provided.

## 2023-01-15 NOTE — Progress Notes (Signed)
RN spoke with Dr. Joesphine Bare OR scheduler Tuesday, May 21st and asked for office to place surgical orders. Scheduler said she would. Pt arrived for her PAT appointment May 22nd and no orders have been placed.

## 2023-01-16 NOTE — Progress Notes (Signed)
Anesthesia Chart Review:  Case: [id number] Date/Time: 01/22/23 1202   Procedure: Right, Left L4-5 Microdiscectomy (Bilateral)   Anesthesia type: General   Pre-op diagnosis: Disc displacement, Lumbar region   Location: [facility] ROOM 19 / [facility]   Surgeons: Coletta Memos, MD       DISCUSSION: Patient is a 32 year old female scheduled for the above procedure.  History includes never smoker, asthma, autism, ADHD, GERD, ulcerative colitis, hypothyroidism, OSA (intolerant to CPAP), anemia, migraines, right cephalic vein partial thrombosis (05/25/13), PE (in setting of s/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 S/p EGD with esophageal dilation, evidence of Barrett's esophagus. BMI is consistent to morbid obesity.  Anesthesia team to evaluate on the day of surgery.   VS: BP 121/75   Pulse 82   Temp 37.2 C (Oral)   Resp 20   Ht 5\' 8"  (1.727 m)   Wt (!) 150.6 kg   LMP 01/13/2023   SpO2 100%   BMI 50.48 kg/m    PROVIDERS: Kathrynn Running, PA-C is listed as PCP. Per PAT notes, PCP is Gwendolyn Fill, [place]. Duaine Dredge, DO is psychiatrist ([facility])  Haroldine Laws, MD is [facility] [place]) Ether Griffins, Gery Pray, MD is pulmonologist [facility])   LABS: Labs reviewed: Acceptable for surgery. (all labs ordered are listed, but only abnormal results are displayed)  Labs Reviewed  SURGICAL PCR SCREEN - Abnormal; Notable for the following components:      Result Value   Staphylococcus aureus POSITIVE (*)    All other components within normal limits  BASIC METABOLIC PANEL - Abnormal; Notable for the following components:   Potassium 3.4 (*)    All other components within normal limits  CBC - Abnormal; Notable for the following components:   Hemoglobin 11.9 (*)    All other components within normal limits      OTHER: EGD 01/10/23 ([facility]): mpression: 32 year old female has a reported history of Barrett's esophagus.  She has had her esophagus dilated in the past.  She was  referred for evaluation of dysphagia.  There are multiple findings today  including a large deep ulcer at 30 cm which is above her Barrett's  segment.  This was biopsied separately.  There is evidence of Barrett's  with some small shallow ulcers within this mucosa.  This was biopsied as  well.  There is multifocal stricturing which was dilated to a maximum  extent of 54 [place].  There was a deep rent consistent with eosinophilic  esophagitis in the proximal esophagus predominantly after dilatation.  The  mucosa in the proximal esophagus appeared furrowed.   Recommendations:  Optimistically the dilatation today improves the patient's dysphagia.  Confirm high-dose proton pump inhibitor usage.  Follow-up pathology results.  I suspect the patient probably benefits from an early interval  surveillance endoscopy given today's findings pending pathology results.    Spirometry 04/16/22 ([facility]): IMPRESSION: Moderate obstructive ventilatory defect  Normal diffusion capacity    IMAGES: CXR 11/20/22 ([facility]): FINDINGS: Supportive devices: None. Cardiovascular/lungs/pleura: Cardiac silhouette and pulmonary vasculature are within normal limits. No focal airspace consolidations. No pleural effusions or pneumothorax. Other: No acute displaced fractures. There is no evidence of acute cardiac or pulmonary abnormality.  MRI L-spine 09/10/22: IMPRESSION: Increased degenerative changes at L4-L5 with a right central/paracentral disc herniation which results in advanced narrowing of the right lateral recess with mass effect  on the traversing right L5 nerve roots.    CTA Chest 06/18/22 ([facility]): IMPRESSION: 1.  No acute pulmonary thromboembolic disease identified.  2.  No active airspace disease.  3.  Similar mild left  axillary adenopathy, nonspecific.   CT T/L-spine 05/06/22: IMPRESSION: - Minor thoracic spine degenerative changes. - Central disc protrusion at L4-L5 with potentially progressed, now marked canal stenosis in comparison to 2019 MRI.    EKG: 10/14/22 ([facility]): Sinus tachycardia at 108 bpm.   CV:  Echo 12/06/20 ([facility]): Left Ventricle: EF: 55-60%.    Left Ventricle: Wall motion is normal.    Right Ventricle: Right ventricle was not well visualized. Right  ventricle is normal.     Past Medical History:  Diagnosis Date   05/25/13    partial occlusion   ADHD    Anemia    Hx   Anxiety    Asthma    Autism    Depression    Esophagitis    GERD (gastroesophageal reflux disease)    Headache    Mirgraines   Hypothyroidism    Pulmonary embolism (HCC) 08/12/2020   [facility]   Sleep apnea    Thyroid disease    Ulcerative colitis [facility])     Past Surgical History:  Procedure Laterality Date   BACK SURGERY  2019   Dr. Franky Macho Lumbar Surgery   ESOPHAGUS SURGERY  01/10/2023   Stretching   KNEE ARTHROSCOPY     KNEE SURGERY Left 2022   Patella Surgery   KNEE SURGERY Right    LUMBAR LAMINECTOMY/DECOMPRESSION MICRODISCECTOMY Right 05/28/2018   Procedure: Right Lumbar Five Sacral One Microdiscectomy;  Surgeon: Coletta Memos, MD;  Location: [facility] OR;  Service: Neurosurgery;  Laterality: Right;  Right Lumbar Five Sacral One Microdiscectomy    MEDICATIONS:  albuterol (PROVENTIL HFA;VENTOLIN HFA) 108 (90 Base) MCG/ACT inhaler   budesonide-formoterol (SYMBICORT) 80-4.5 MCG/ACT inhaler   Cholecalciferol (VITAMIN D3) 1.25 MG (50000 UT) CAPS   dicyclomine (BENTYL) 10 MG capsule   hydrocortisone 2.5 % lotion   hydrOXYzine (VISTARIL) 50 MG capsule   ketoconazole (NIZORAL) 2 % cream   levothyroxine (SYNTHROID) 75 MCG tablet   montelukast (SINGULAIR) 10 MG tablet   norethindrone (MICRONOR) 0.35 MG tablet   pantoprazole (PROTONIX) 40 MG tablet   sertraline (ZOLOFT) 50 MG  tablet   No current facility-administered medications for this encounter.    Shonna Chock, PA-C Surgical Short Stay/Anesthesiology [facility] Phone [phone] [facility] Phone [phone] 01/17/2023 7:43 AM

## 2023-01-17 ENCOUNTER — Encounter (HOSPITAL_COMMUNITY): Payer: Self-pay

## 2023-01-17 NOTE — Anesthesia Preprocedure Evaluation (Signed)
Anesthesia Evaluation  Patient identified by MRN, date of birth, ID band Patient awake    Reviewed: Allergy & Precautions, NPO status , Patient's Chart, lab work & pertinent test results  History of Anesthesia Complications Negative for: history of anesthetic complications  Airway Mallampati: III  TM Distance: >3 FB Neck ROM: Full    Dental  (+) Dental Advisory Given, Chipped, Teeth Intact,    Pulmonary asthma , sleep apnea    breath sounds clear to auscultation       Cardiovascular (-) angina + DVT  (-) Past MI  Rhythm:Regular     Neuro/Psych  Headaches PSYCHIATRIC DISORDERS Anxiety      Neuromuscular disease    GI/Hepatic PUD,GERD  ,,  Endo/Other  Hypothyroidism  Morbid obesity  Renal/GU      Musculoskeletal  (+) Arthritis ,    Abdominal   Peds  Hematology  (+) Blood dyscrasia, anemia Lab Results      Component                Value               Date                      WBC                      6.3                 01/15/2023                HGB                      11.9 (L)            01/15/2023                HCT                      38.5                01/15/2023                MCV                      90.8                01/15/2023                PLT                      226                 01/15/2023               Anesthesia Other Findings , asthma, autism, ADHD, GERD, ulcerative colitis, hypothyroidism, OSA (intolerant to CPAP), anemia, migraines, right cephalic vein partial thrombosis (05/25/13), PE (in setting of s/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 S/p EGD with esophageal dilation, evidence of Barrett's esophagus  Reproductive/Obstetrics Lab Results      Component                Value               Date  PREGTESTUR               NEGATIVE            03/19/2018                PREGSERUM                NEGATIVE            01/22/2023                HCG                      <5.0                05/06/2022                                        Anesthesia Physical Anesthesia Plan  ASA: 3  Anesthesia Plan: General   Post-op Pain Management: Ofirmev IV (intra-op)*   Induction: Intravenous  PONV Risk Score and Plan: 3 and Ondansetron and Dexamethasone  Airway Management Planned: Oral ETT  Additional Equipment: None  Intra-op Plan:   Post-operative Plan: Extubation in OR  Informed Consent: I have reviewed the patients History and Physical, chart, labs and discussed the procedure including the risks, benefits and alternatives for the proposed anesthesia with the patient or authorized representative who has indicated his/her understanding and acceptance.     Dental advisory given  Plan Discussed with: CRNA  Anesthesia Plan Comments: (PAT note written 01/17/2023 by Shonna Chock, PA-C.  )       Anesthesia Quick Evaluation

## 2023-01-22 ENCOUNTER — Encounter (HOSPITAL_COMMUNITY): Payer: Self-pay | Admitting: Neurosurgery

## 2023-01-22 ENCOUNTER — Inpatient Hospital Stay (HOSPITAL_COMMUNITY)
Admission: AD | Admit: 2023-01-22 | Discharge: 2023-01-29 | DRG: 519 | Disposition: A | Discharge status: Home / Self Care | Payer: Medicare HMO | Source: Ambulatory Visit | Attending: Neurosurgery | Admitting: Neurosurgery

## 2023-01-22 ENCOUNTER — Ambulatory Visit (HOSPITAL_COMMUNITY): Payer: Medicare HMO | Admitting: Vascular Surgery

## 2023-01-22 ENCOUNTER — Ambulatory Visit (HOSPITAL_COMMUNITY)
Admission: AD | Disposition: A | Discharge status: Home / Self Care | Payer: Self-pay | Source: Ambulatory Visit | Attending: Neurosurgery

## 2023-01-22 ENCOUNTER — Ambulatory Visit (HOSPITAL_COMMUNITY): Payer: Medicare HMO

## 2023-01-22 ENCOUNTER — Other Ambulatory Visit: Payer: Self-pay

## 2023-01-22 ENCOUNTER — Ambulatory Visit (HOSPITAL_BASED_OUTPATIENT_CLINIC_OR_DEPARTMENT_OTHER): Payer: Medicare HMO | Admitting: Anesthesiology

## 2023-01-22 DIAGNOSIS — K219 Gastro-esophageal reflux disease without esophagitis: Secondary | ICD-10-CM | POA: Diagnosis present

## 2023-01-22 DIAGNOSIS — J45909 Unspecified asthma, uncomplicated: Secondary | ICD-10-CM | POA: Diagnosis present

## 2023-01-22 DIAGNOSIS — Z7951 Long term (current) use of inhaled steroids: Secondary | ICD-10-CM

## 2023-01-22 DIAGNOSIS — M5126 Other intervertebral disc displacement, lumbar region: Secondary | ICD-10-CM | POA: Diagnosis not present

## 2023-01-22 DIAGNOSIS — Z91018 Allergy to other foods: Secondary | ICD-10-CM

## 2023-01-22 DIAGNOSIS — Z91012 Allergy to eggs: Secondary | ICD-10-CM

## 2023-01-22 DIAGNOSIS — Z888 Allergy status to other drugs, medicaments and biological substances status: Secondary | ICD-10-CM

## 2023-01-22 DIAGNOSIS — Z79899 Other long term (current) drug therapy: Secondary | ICD-10-CM

## 2023-01-22 DIAGNOSIS — I82409 Acute embolism and thrombosis of unspecified deep veins of unspecified lower extremity: Secondary | ICD-10-CM | POA: Diagnosis not present

## 2023-01-22 DIAGNOSIS — F840 Autistic disorder: Secondary | ICD-10-CM | POA: Diagnosis present

## 2023-01-22 DIAGNOSIS — K5900 Constipation, unspecified: Secondary | ICD-10-CM | POA: Diagnosis not present

## 2023-01-22 DIAGNOSIS — Z01818 Encounter for other preprocedural examination: Secondary | ICD-10-CM

## 2023-01-22 DIAGNOSIS — E039 Hypothyroidism, unspecified: Secondary | ICD-10-CM | POA: Diagnosis present

## 2023-01-22 DIAGNOSIS — Z6841 Body Mass Index (BMI) 40.0 and over, adult: Secondary | ICD-10-CM

## 2023-01-22 DIAGNOSIS — F32A Depression, unspecified: Secondary | ICD-10-CM | POA: Diagnosis present

## 2023-01-22 DIAGNOSIS — Z79890 Hormone replacement therapy (postmenopausal): Secondary | ICD-10-CM

## 2023-01-22 DIAGNOSIS — Z91030 Bee allergy status: Secondary | ICD-10-CM

## 2023-01-22 DIAGNOSIS — G4730 Sleep apnea, unspecified: Secondary | ICD-10-CM | POA: Diagnosis present

## 2023-01-22 DIAGNOSIS — Z91011 Allergy to milk products: Secondary | ICD-10-CM

## 2023-01-22 DIAGNOSIS — D638 Anemia in other chronic diseases classified elsewhere: Secondary | ICD-10-CM

## 2023-01-22 HISTORY — PX: LUMBAR LAMINECTOMY/DECOMPRESSION MICRODISCECTOMY: SHX5026

## 2023-01-22 LAB — CBC
HCT: 37 % (ref 36.0–46.0)
Hemoglobin: 11.4 g/dL — ABNORMAL LOW (ref 12.0–15.0)
MCH: 27.8 pg (ref 26.0–34.0)
MCHC: 30.8 g/dL (ref 30.0–36.0)
MCV: 90.2 fL (ref 80.0–100.0)
Platelets: 206 10*3/uL (ref 150–400)
RBC: 4.1 MIL/uL (ref 3.87–5.11)
RDW: 12.7 % (ref 11.5–15.5)
WBC: 4.4 10*3/uL (ref 4.0–10.5)
nRBC: 0 % (ref 0.0–0.2)

## 2023-01-22 LAB — CREATININE, SERUM
Creatinine, Ser: 0.87 mg/dL (ref 0.44–1.00)
GFR, Estimated: >60 mL/min (ref >60)

## 2023-01-22 LAB — HCG, SERUM, QUALITATIVE: Preg, Serum: NEGATIVE

## 2023-01-22 SURGERY — LUMBAR LAMINECTOMY/DECOMPRESSION MICRODISCECTOMY 1 LEVEL
Anesthesia: General | Site: Spine Lumbar | Laterality: Right

## 2023-01-22 MED ORDER — MENTHOL 3 MG MT LOZG: 1.0000 | LOZENGE | OROMUCOSAL | Status: DC | PRN

## 2023-01-22 MED ORDER — CHLORHEXIDINE GLUCONATE CLOTH 2 % EX PADS: 6.0000 | MEDICATED_PAD | Freq: Once | CUTANEOUS | Status: DC

## 2023-01-22 MED ORDER — DICYCLOMINE HCL 10 MG PO CAPS: 10.0000 mg | ORAL_CAPSULE | Freq: Three times a day (TID) | ORAL | Status: DC

## 2023-01-22 MED ORDER — FENTANYL CITRATE (PF) 250 MCG/5ML IJ SOLN
INTRAMUSCULAR | Status: AC
  Filled 2023-01-22: qty 5

## 2023-01-22 MED ORDER — MOMETASONE FURO-FORMOTEROL FUM 100-5 MCG/ACT IN AERO
2.0000 | INHALATION_SPRAY | Freq: Two times a day (BID) | RESPIRATORY_TRACT | Status: DC
  Administered 2023-01-22 – 2023-01-29 (×14): 2 via RESPIRATORY_TRACT
  Filled 2023-01-22 (×2): qty 8.8

## 2023-01-22 MED ORDER — MONTELUKAST SODIUM 10 MG PO TABS
10.0000 mg | ORAL_TABLET | Freq: Every morning | ORAL | Status: DC
  Administered 2023-01-23 – 2023-01-29 (×6): 10 mg via ORAL
  Filled 2023-01-22 (×7): qty 1

## 2023-01-22 MED ORDER — THROMBIN 5000 UNITS EX SOLR
CUTANEOUS | Status: AC
  Filled 2023-01-22: qty 10000

## 2023-01-22 MED ORDER — LACTATED RINGERS IV SOLN: INTRAVENOUS | Status: DC

## 2023-01-22 MED ORDER — ACETAMINOPHEN 650 MG RE SUPP: 650.0000 mg | RECTAL | Status: DC | PRN

## 2023-01-22 MED ORDER — CHLORHEXIDINE GLUCONATE 0.12 % MT SOLN
15.0000 mL | Freq: Once | OROMUCOSAL | Status: AC
  Administered 2023-01-22: 15 mL via OROMUCOSAL
  Filled 2023-01-22: qty 15

## 2023-01-22 MED ORDER — 0.9 % SODIUM CHLORIDE (POUR BTL) OPTIME
TOPICAL | Status: DC | PRN
  Administered 2023-01-22: 1000 mL

## 2023-01-22 MED ORDER — PROPOFOL 10 MG/ML IV BOLUS
INTRAVENOUS | Status: DC | PRN
  Administered 2023-01-22: 160 mg via INTRAVENOUS

## 2023-01-22 MED ORDER — CEFAZOLIN SODIUM-DEXTROSE 2-4 GM/100ML-% IV SOLN: 2.0000 g | INTRAVENOUS | Status: AC

## 2023-01-22 MED ORDER — ACETAMINOPHEN 10 MG/ML IV SOLN
INTRAVENOUS | Status: AC
  Filled 2023-01-22: qty 100

## 2023-01-22 MED ORDER — ACETAMINOPHEN 325 MG PO TABS: 650.0000 mg | ORAL_TABLET | ORAL | Status: DC | PRN

## 2023-01-22 MED ORDER — POTASSIUM CHLORIDE IN NACL 20-0.9 MEQ/L-% IV SOLN
INTRAVENOUS | Status: DC
  Filled 2023-01-22 (×7): qty 1000

## 2023-01-22 MED ORDER — PROPOFOL 10 MG/ML IV BOLUS
INTRAVENOUS | Status: AC
  Filled 2023-01-22: qty 20

## 2023-01-22 MED ORDER — SERTRALINE HCL 100 MG PO TABS
100.0000 mg | ORAL_TABLET | Freq: Every day | ORAL | Status: DC
  Administered 2023-01-22 – 2023-01-29 (×7): 100 mg via ORAL
  Filled 2023-01-22 (×8): qty 1

## 2023-01-22 MED ORDER — ONDANSETRON HCL 4 MG/2ML IJ SOLN
4.0000 mg | Freq: Four times a day (QID) | INTRAMUSCULAR | Status: DC | PRN
  Administered 2023-01-24 – 2023-01-25 (×2): 4 mg via INTRAVENOUS
  Filled 2023-01-22 (×2): qty 2

## 2023-01-22 MED ORDER — LIDOCAINE-EPINEPHRINE 0.5 %-1:200000 IJ SOLN
INTRAMUSCULAR | Status: DC | PRN
  Administered 2023-01-22: 30 mL

## 2023-01-22 MED ORDER — MIDAZOLAM HCL 2 MG/2ML IJ SOLN
INTRAMUSCULAR | Status: DC | PRN
  Administered 2023-01-22: 2 mg via INTRAVENOUS

## 2023-01-22 MED ORDER — ONDANSETRON HCL 4 MG PO TABS: 4.0000 mg | ORAL_TABLET | Freq: Four times a day (QID) | ORAL | Status: DC | PRN

## 2023-01-22 MED ORDER — FENTANYL CITRATE (PF) 250 MCG/5ML IJ SOLN
INTRAMUSCULAR | Status: DC | PRN
  Administered 2023-01-22 (×5): 50 ug via INTRAVENOUS

## 2023-01-22 MED ORDER — ZOLPIDEM TARTRATE 5 MG PO TABS: 5.0000 mg | ORAL_TABLET | Freq: Every evening | ORAL | Status: DC | PRN

## 2023-01-22 MED ORDER — HYDROCODONE-ACETAMINOPHEN 5-325 MG PO TABS
1.0000 | ORAL_TABLET | ORAL | Status: DC | PRN
  Administered 2023-01-27: 1 via ORAL
  Filled 2023-01-22: qty 1

## 2023-01-22 MED ORDER — SODIUM CHLORIDE 0.9% FLUSH
3.0000 mL | Freq: Two times a day (BID) | INTRAVENOUS | Status: DC
  Administered 2023-01-23 – 2023-01-29 (×11): 3 mL via INTRAVENOUS

## 2023-01-22 MED ORDER — DIAZEPAM 5 MG PO TABS: 5.0000 mg | ORAL_TABLET | Freq: Four times a day (QID) | ORAL | Status: DC | PRN

## 2023-01-22 MED ORDER — ORAL CARE MOUTH RINSE: 15.0000 mL | Freq: Once | OROMUCOSAL | Status: AC

## 2023-01-22 MED ORDER — ONDANSETRON HCL 4 MG/2ML IJ SOLN
INTRAMUSCULAR | Status: DC | PRN
  Administered 2023-01-22: 4 mg via INTRAVENOUS

## 2023-01-22 MED ORDER — OXYCODONE HCL 5 MG/5ML PO SOLN: 5.0000 mg | Freq: Once | ORAL | Status: DC | PRN

## 2023-01-22 MED ORDER — HYDROCODONE-ACETAMINOPHEN 7.5-325 MG PO TABS
2.0000 | ORAL_TABLET | ORAL | Status: DC | PRN
  Administered 2023-01-24: 2 via ORAL
  Filled 2023-01-22 (×2): qty 2

## 2023-01-22 MED ORDER — MIDAZOLAM HCL 2 MG/2ML IJ SOLN
INTRAMUSCULAR | Status: AC
  Filled 2023-01-22: qty 2

## 2023-01-22 MED ORDER — SUGAMMADEX SODIUM 200 MG/2ML IV SOLN
INTRAVENOUS | Status: DC | PRN
  Administered 2023-01-22 (×3): 200 mg via INTRAVENOUS

## 2023-01-22 MED ORDER — ALBUTEROL SULFATE HFA 108 (90 BASE) MCG/ACT IN AERS: 1.0000 | INHALATION_SPRAY | Freq: Four times a day (QID) | RESPIRATORY_TRACT | Status: DC | PRN

## 2023-01-22 MED ORDER — ACETAMINOPHEN 10 MG/ML IV SOLN
INTRAVENOUS | Status: DC | PRN
  Administered 2023-01-22: 1000 mg via INTRAVENOUS

## 2023-01-22 MED ORDER — SODIUM CHLORIDE 0.9 % IV SOLN: 250.0000 mL | INTRAVENOUS | Status: DC

## 2023-01-22 MED ORDER — FENTANYL CITRATE (PF) 100 MCG/2ML IJ SOLN: 25.0000 ug | INTRAMUSCULAR | Status: DC | PRN

## 2023-01-22 MED ORDER — LIDOCAINE 2% (20 MG/ML) 5 ML SYRINGE
INTRAMUSCULAR | Status: DC | PRN
  Administered 2023-01-22: 80 mg via INTRAVENOUS

## 2023-01-22 MED ORDER — SENNA 8.6 MG PO TABS
1.0000 | ORAL_TABLET | Freq: Two times a day (BID) | ORAL | Status: DC
  Administered 2023-01-22 – 2023-01-29 (×11): 8.6 mg via ORAL
  Filled 2023-01-22 (×13): qty 1

## 2023-01-22 MED ORDER — LIDOCAINE-EPINEPHRINE 0.5 %-1:200000 IJ SOLN
INTRAMUSCULAR | Status: AC
  Filled 2023-01-22: qty 50

## 2023-01-22 MED ORDER — ALBUTEROL SULFATE (2.5 MG/3ML) 0.083% IN NEBU: 2.5000 mg | INHALATION_SOLUTION | Freq: Four times a day (QID) | RESPIRATORY_TRACT | Status: DC | PRN

## 2023-01-22 MED ORDER — KETOROLAC TROMETHAMINE 15 MG/ML IJ SOLN
15.0000 mg | Freq: Four times a day (QID) | INTRAMUSCULAR | Status: AC
  Administered 2023-01-22 – 2023-01-23 (×4): 15 mg via INTRAVENOUS
  Filled 2023-01-22 (×4): qty 1

## 2023-01-22 MED ORDER — THROMBIN (RECOMBINANT) 5000 UNITS EX SOLR: CUTANEOUS | Status: DC | PRN

## 2023-01-22 MED ORDER — HEPARIN SODIUM (PORCINE) 5000 UNIT/ML IJ SOLN
5000.0000 [IU] | Freq: Three times a day (TID) | INTRAMUSCULAR | Status: DC
  Administered 2023-01-22 – 2023-01-29 (×20): 5000 [IU] via SUBCUTANEOUS
  Filled 2023-01-22 (×20): qty 1

## 2023-01-22 MED ORDER — DICYCLOMINE HCL 10 MG PO CAPS
10.0000 mg | ORAL_CAPSULE | Freq: Three times a day (TID) | ORAL | Status: DC
  Administered 2023-01-22 – 2023-01-29 (×22): 10 mg via ORAL
  Filled 2023-01-22 (×32): qty 1

## 2023-01-22 MED ORDER — DEXAMETHASONE SODIUM PHOSPHATE 10 MG/ML IJ SOLN
INTRAMUSCULAR | Status: DC | PRN
  Administered 2023-01-22: 10 mg via INTRAVENOUS

## 2023-01-22 MED ORDER — HYDROXYZINE PAMOATE 50 MG PO CAPS: 50.0000 mg | ORAL_CAPSULE | Freq: Every day | ORAL | Status: DC | PRN

## 2023-01-22 MED ORDER — OXYCODONE HCL 5 MG PO TABS: 5.0000 mg | ORAL_TABLET | Freq: Once | ORAL | Status: DC | PRN

## 2023-01-22 MED ORDER — SODIUM CHLORIDE 0.9% FLUSH: 3.0000 mL | INTRAVENOUS | Status: DC | PRN

## 2023-01-22 MED ORDER — OXYCODONE HCL ER 10 MG PO T12A
10.0000 mg | EXTENDED_RELEASE_TABLET | Freq: Two times a day (BID) | ORAL | Status: DC
  Administered 2023-01-22 – 2023-01-29 (×12): 10 mg via ORAL
  Filled 2023-01-22 (×13): qty 1

## 2023-01-22 MED ORDER — CEFAZOLIN IN SODIUM CHLORIDE 3-0.9 GM/100ML-% IV SOLN
3.0000 g | INTRAVENOUS | Status: AC
  Administered 2023-01-22: 3 g via INTRAVENOUS
  Filled 2023-01-22: qty 100

## 2023-01-22 MED ORDER — PHENOL 1.4 % MT LIQD: 1.0000 | OROMUCOSAL | Status: DC | PRN

## 2023-01-22 MED ORDER — ACETAMINOPHEN 160 MG/5ML PO SOLN: 1000.0000 mg | Freq: Once | ORAL | Status: DC | PRN

## 2023-01-22 MED ORDER — ACETAMINOPHEN 10 MG/ML IV SOLN: 1000.0000 mg | Freq: Once | INTRAVENOUS | Status: DC | PRN

## 2023-01-22 MED ORDER — HYDROMORPHONE HCL 1 MG/ML IJ SOLN
INTRAMUSCULAR | Status: AC
  Filled 2023-01-22: qty 0.5

## 2023-01-22 MED ORDER — NORETHINDRONE 0.35 MG PO TABS: 1.0000 | ORAL_TABLET | Freq: Every day | ORAL | Status: DC

## 2023-01-22 MED ORDER — MOMETASONE FURO-FORMOTEROL FUM 100-5 MCG/ACT IN AERO: 2.0000 | INHALATION_SPRAY | Freq: Two times a day (BID) | RESPIRATORY_TRACT | Status: DC

## 2023-01-22 MED ORDER — ROCURONIUM BROMIDE 10 MG/ML (PF) SYRINGE
PREFILLED_SYRINGE | INTRAVENOUS | Status: DC | PRN
  Administered 2023-01-22: 80 mg via INTRAVENOUS

## 2023-01-22 MED ORDER — PANTOPRAZOLE SODIUM 40 MG PO TBEC
40.0000 mg | DELAYED_RELEASE_TABLET | Freq: Two times a day (BID) | ORAL | Status: DC
  Administered 2023-01-22 – 2023-01-29 (×12): 40 mg via ORAL
  Filled 2023-01-22 (×13): qty 1

## 2023-01-22 MED ORDER — ACETAMINOPHEN 500 MG PO TABS: 1000.0000 mg | ORAL_TABLET | Freq: Once | ORAL | Status: DC | PRN

## 2023-01-22 MED ORDER — LEVOTHYROXINE SODIUM 75 MCG PO TABS
75.0000 ug | ORAL_TABLET | Freq: Every day | ORAL | Status: DC
  Administered 2023-01-23 – 2023-01-29 (×5): 75 ug via ORAL
  Filled 2023-01-22 (×7): qty 1

## 2023-01-22 MED ORDER — HYDROXYZINE HCL 10 MG PO TABS
50.0000 mg | ORAL_TABLET | Freq: Every day | ORAL | Status: DC | PRN
  Administered 2023-01-28: 50 mg via ORAL
  Filled 2023-01-22: qty 5

## 2023-01-22 SURGICAL SUPPLY — 48 items
ADH SKN CLS APL DERMABOND .7 (GAUZE/BANDAGES/DRESSINGS) ×1
APL SKNCLS STERI-STRIP NONHPOA (GAUZE/BANDAGES/DRESSINGS)
BAG COUNTER SPONGE SURGICOUNT (BAG) ×1 IMPLANT
BAG SPNG CNTER NS LX DISP (BAG) ×1
BENZOIN TINCTURE PRP APPL 2/3 (GAUZE/BANDAGES/DRESSINGS) IMPLANT
BLADE CLIPPER SURG (BLADE) IMPLANT
BUR MATCHSTICK NEURO 3.0 LAGG (BURR) ×1 IMPLANT
BUR PRECISION FLUTE 5.0 (BURR) IMPLANT
CANISTER SUCT 3000ML PPV (MISCELLANEOUS) ×1 IMPLANT
DERMABOND ADVANCED .7 DNX12 (GAUZE/BANDAGES/DRESSINGS) ×1 IMPLANT
DRAPE LAPAROTOMY 100X72X124 (DRAPES) ×1 IMPLANT
DRAPE MICROSCOPE SLANT 54X150 (MISCELLANEOUS) ×1 IMPLANT
DRAPE SURG 17X23 STRL (DRAPES) ×1 IMPLANT
DRSG OPSITE POSTOP 4X6 (GAUZE/BANDAGES/DRESSINGS) IMPLANT
DURAPREP 26ML APPLICATOR (WOUND CARE) ×1 IMPLANT
ELECT REM PT RETURN 9FT ADLT (ELECTROSURGICAL) ×1
ELECTRODE REM PT RTRN 9FT ADLT (ELECTROSURGICAL) ×1 IMPLANT
GAUZE 4X4 16PLY ~~LOC~~+RFID DBL (SPONGE) IMPLANT
GAUZE SPONGE 4X4 12PLY STRL (GAUZE/BANDAGES/DRESSINGS) IMPLANT
GLOVE ECLIPSE 6.5 STRL STRAW (GLOVE) ×1 IMPLANT
GLOVE EXAM NITRILE XL STR (GLOVE) IMPLANT
GOWN STRL REUS W/ TWL LRG LVL3 (GOWN DISPOSABLE) ×2 IMPLANT
GOWN STRL REUS W/ TWL XL LVL3 (GOWN DISPOSABLE) IMPLANT
GOWN STRL REUS W/TWL 2XL LVL3 (GOWN DISPOSABLE) IMPLANT
GOWN STRL REUS W/TWL LRG LVL3 (GOWN DISPOSABLE) ×2
GOWN STRL REUS W/TWL XL LVL3 (GOWN DISPOSABLE)
KIT BASIN OR (CUSTOM PROCEDURE TRAY) ×1 IMPLANT
KIT TURNOVER KIT B (KITS) ×1 IMPLANT
NDL HYPO 25X1 1.5 SAFETY (NEEDLE) ×1 IMPLANT
NDL SPNL 18GX3.5 QUINCKE PK (NEEDLE) IMPLANT
NEEDLE HYPO 25X1 1.5 SAFETY (NEEDLE) ×1 IMPLANT
NEEDLE SPNL 18GX3.5 QUINCKE PK (NEEDLE) IMPLANT
NS IRRIG 1000ML POUR BTL (IV SOLUTION) ×1 IMPLANT
PACK LAMINECTOMY NEURO (CUSTOM PROCEDURE TRAY) ×1 IMPLANT
PAD ARMBOARD 7.5X6 YLW CONV (MISCELLANEOUS) ×3 IMPLANT
SOL ELECTROSURG ANTI STICK (MISCELLANEOUS) ×1
SOLUTION ELECTROSURG ANTI STCK (MISCELLANEOUS) ×1 IMPLANT
SPIKE FLUID TRANSFER (MISCELLANEOUS) ×1 IMPLANT
SPONGE SURGIFOAM ABS GEL SZ50 (HEMOSTASIS) ×1 IMPLANT
SPONGE T-LAP 4X18 ~~LOC~~+RFID (SPONGE) IMPLANT
STRIP CLOSURE SKIN 1/2X4 (GAUZE/BANDAGES/DRESSINGS) IMPLANT
SUT VIC AB 0 CT1 18XCR BRD8 (SUTURE) ×1 IMPLANT
SUT VIC AB 0 CT1 8-18 (SUTURE) ×1
SUT VIC AB 2-0 CT1 18 (SUTURE) ×1 IMPLANT
SUT VIC AB 3-0 SH 8-18 (SUTURE) ×1 IMPLANT
TOWEL GREEN STERILE (TOWEL DISPOSABLE) ×1 IMPLANT
TOWEL GREEN STERILE FF (TOWEL DISPOSABLE) ×1 IMPLANT
WATER STERILE IRR 1000ML POUR (IV SOLUTION) ×1 IMPLANT

## 2023-01-22 NOTE — H&P (Signed)
LMP 01/13/2023  Jasmine Hickman is a patient of mine whom I took to the operating room to take out a disc herniation at L5-S1, and this was done in October 2019.  She did well at that time.  She also had a herniated disc at 4-5, but I thought the disc at 5-1 was causing the majority of her problems.  She came back to see me in July 2022 worried about the possibility of a disc causing some pain that went all the way from her neck down to her back.  I told her I thought that that was unlikely and she left and again did well.  She returns today because she is having pain again, and she describes it as being 10/10.  She had another MRI which shows the same disc herniation at L4-5.  At 5-1 she does not have a recurrence, it looks good.  At 4-5, she has a large disc, which is not calcified as she did have a CT done recently also of the lumbar spine.  So, I am going to have her try epidural steroid injections to see if we can get by this without doing an operation.       Jasmine Hickman is 316 pounds.  Temperature is 98.3.  Blood pressure 132/87.  Pulse is 90.  Pain is 10/10.       She was denied surgery by another surgeon whom she was sent to prior to coming to see me.  He said her BMI was simply too much and that he did not think it was in her best interest to undergo an operation.       She is alert, oriented x 4.  She answers all questions appropriately.  Memory, language, attention span, and fund of knowledge are normal.  Speech is clear.  It is also fluent.  Hearing intact to voice.  Uvula elevates in midline.  Shoulder shrug is normal.  Tongue protrudes in the midline. Mrs. Hardaway underwent the injection.  She is no better.  She has 10/10 pain.  Although this disc herniation at 4-5 was there previously, I cannot ignore it.  It is the only other thing going on in the spine.  It is huge, so I will just go ahead and perform a discectomy in the hopes that this will improve her situation.

## 2023-01-22 NOTE — Op Note (Signed)
01/22/2023  6:40 PM  PATIENT:  Jasmine Hickman  32 y.o. female  PRE-OPERATIVE DIAGNOSIS:  Disc displacement, Lumbar region 4/5, right  POST-OPERATIVE DIAGNOSIS:  Disc displacement, Lumbar region 4/5, right  PROCEDURE:  Procedure(s): Right Lumbar Four-Five Microdiscectomy  SURGEON:   Surgeon(s): Coletta Memos, MD Bedelia Person, MD  ASSISTANTS:Thomas, Christiane Ha  ANESTHESIA:   local and general  EBL:  Total I/O In: 1100 [I.V.:1000; IV Piggyback:100] Out: -   BLOOD ADMINISTERED:none  CELL SAVER GIVEN:not used  COUNT:per nursing  DRAINS: none   SPECIMEN:  No Specimen  DICTATION: [name] was taken to the operating room, intubated and placed under a general anesthetic without difficulty. She was positioned prone on a Jackson table with all pressure points padded. Her back was prepped and draped in a sterile manner. I opened the skin with a 10 blade and carried the dissection down to the thoracolumbar fascia. I used both sharp dissection and the monopolar cautery to expose the lamina of L4, and 5. I confirmed my location with an intraoperative xray.  I used the drill, Kerrison punches, and curettes to perform a semihemilaminectomy of L4. I used the punches to remove the ligamentum flavum to expose the thecal sac. I brought the microscope into the operative field and with Dr.Thomas' assistance we started our decompression of the spinal canal, thecal sac and L5 root(s). I cauterized epidural veins overlying the disc space then divided them sharply. I opened the disc space with a 15 blade and proceeded with the discectomy. I used pituitary rongeurs, curettes, and other instruments to remove disc material. After the discectomy was completed I inspected the L5 nerve root and felt it was well decompressed. I explored rostrally, laterally, medially, and caudally and was satisfied with the decompression. I irrigated the wound, then closed in layers. I approximated the thoracolumbar fascia,  subcutaneous, and subcuticular planes with vicryl sutures. I used dermabond for a sterile dressing.   PLAN OF CARE: Admit for overnight observation  PATIENT DISPOSITION:  PACU - hemodynamically stable.   Delay start of Pharmacological VTE agent (>24hrs) due to surgical blood loss or risk of bleeding:  yes

## 2023-01-22 NOTE — Transfer of Care (Signed)
Immediate Anesthesia Transfer of Care Note  Patient: Jasmine Hickman  Procedure(s) Performed: Right Lumbar Four-Five Microdiscectomy (Right: Spine Lumbar)  Patient Location: PACU  Anesthesia Type:General  Level of Consciousness: drowsy  Airway & Oxygen Therapy: Patient Spontanous Breathing and Patient connected to face mask oxygen  Post-op Assessment: Report given to RN and Post -op Vital signs reviewed and stable  Post vital signs: stable  Last Vitals:  Vitals Value Taken Time  BP 136/84 01/22/23 1823  Temp    Pulse 88 01/22/23 1829  Resp 13 01/22/23 1829  SpO2 100 % 01/22/23 1829  Vitals shown include unvalidated device data.  Last Pain:  Vitals:   01/22/23 1044  TempSrc:   PainSc: 10-Worst pain ever      Patients Stated Pain Goal: 5 (01/22/23 1044)  Complications: There were no known notable events for this encounter.

## 2023-01-22 NOTE — Plan of Care (Signed)

## 2023-01-22 NOTE — Anesthesia Procedure Notes (Signed)
Procedure Name: Intubation Date/Time: 01/22/2023 4:01 PM  Performed by: Loleta Deby Adger, CRNAPre-anesthesia Checklist: Patient identified, Patient being monitored, Timeout performed, Emergency Drugs available and Suction available Patient Re-evaluated:Patient Re-evaluated prior to induction Oxygen Delivery Method: Circle system utilized Preoxygenation: Pre-oxygenation with 100% oxygen Induction Type: IV induction Ventilation: Mask ventilation without difficulty Laryngoscope Size: Mac and 4 Grade View: Grade I Tube type: Oral Tube size: 7.5 mm Number of attempts: 1 Airway Equipment and Method: Stylet Placement Confirmation: ETT inserted through vocal cords under direct vision, positive ETCO2 and breath sounds checked- equal and bilateral Secured at: 22 cm Tube secured with: Tape Dental Injury: Teeth and Oropharynx as per pre-operative assessment

## 2023-01-23 ENCOUNTER — Encounter (HOSPITAL_COMMUNITY): Payer: Self-pay | Admitting: Neurosurgery

## 2023-01-23 MED FILL — Thrombin For Soln 5000 Unit: CUTANEOUS | Qty: 2 | Status: AC

## 2023-01-23 NOTE — Evaluation (Signed)
Physical Therapy Evaluation Patient Details Name: Jasmine Hickman MRN: [id number] DOB: 06-Sep-1990 Today's Date: 01/23/2023  History of Present Illness  The pt is a a 32 yo female presenting 5/29 for microdiscectomy of R L4-5 due to chronic back pain. PMH includes: morbid obesity, autism, asthma, OSA, DVT, PE, arthritis, hypothyroidism, and prior microdiscectomy of L5-S1.   Clinical Impression  Pt in bed upon arrival of PT, agreeable to evaluation at this time. Prior to admission the pt was ambulating without use of DME, reports no recent falls, and was sleeping on the floor but able to complete floor-standing transfers on her own or with some assist from her mother. The now presents with limitations in functional mobility, strength, coordination, endurance, and balance due to above dx, and will continue to benefit from skilled PT to address these deficits. The pt required minA to complete bed mobility and minA to complete sit-stand from EOB. I did not feel it was safe to practice floor-chair transfers at this time due to pt's size and limitations in strength/mobility at this time. The pt was limited to ~25 ft ambulation prior to needing seated rest due to pain, but is able to ambulate with minG and use of RW. The pt's living situation is complicated by lack of another surface to sleep on other than the floor while awaiting move to [place], if returning to her grandparents house could benefit from hospital bed until they move.        Recommendations for follow up therapy are one component of a multi-disciplinary discharge planning process, led by the attending physician.  Recommendations may be updated based on patient status, additional functional criteria and insurance authorization.  Follow Up Recommendations       Assistance Recommended at Discharge Frequent or constant Supervision/Assistance  Patient can return home with the following  A little help with walking and/or transfers;A lot of help  with bathing/dressing/bathroom;Assistance with cooking/housework;Assist for transportation;Help with stairs or ramp for entrance;Direct supervision/assist for medications management;Direct supervision/assist for financial management    Equipment Recommendations Rolling walker (2 wheels)  Recommendations for Other Services       Functional Status Assessment Patient has had a recent decline in their functional status and demonstrates the ability to make significant improvements in function in a reasonable and predictable amount of time.     Precautions / Restrictions Precautions Precautions: Back Precaution Booklet Issued: Yes (comment) Precaution Comments: pt unable to recall from OT session (about 30 min) Restrictions Weight Bearing Restrictions: No      Mobility  Bed Mobility Overal bed mobility: Needs Assistance Bed Mobility: Sidelying to Sit, Rolling Rolling: Min assist Sidelying to sit: Min assist       General bed mobility comments: minA to complete log roll from flat bed, pt holding bed rails despite cues not to. increased time and effort    Transfers Overall transfer level: Needs assistance Equipment used: Rolling walker (2 wheels) Transfers: Sit to/from Stand Sit to Stand: Min assist           General transfer comment: minA to rise to standing with use of RW intially, minG with reps. poor eccentric control to low recliner    Ambulation/Gait Ambulation/Gait assistance: Min guard Gait Distance (Feet): 25 Feet Assistive device: Rolling walker (2 wheels) Gait Pattern/deviations: Step-to pattern Gait velocity: decreased Gait velocity interpretation: <1.31 ft/sec, indicative of household ambulator   General Gait Details: slowed gait with dependence on BUE support. modA without use of RW. no overt buckling or LOB. reports  unable to continue due to pain      Balance Overall balance assessment: Needs assistance             Standing balance comment: BUE  support for gait or SUE support and modA                             Pertinent Vitals/Pain Pain Assessment Pain Assessment: 0-10 Pain Score: 5  Pain Location: back Pain Descriptors / Indicators: Aching, Grimacing, Sore Pain Intervention(s): Limited activity within patient's tolerance, Monitored during session, Premedicated before session, Repositioned    Home Living Family/patient expects to be discharged to:: Private residence Living Arrangements: Parent;Other relatives (mother and grandparents) Available Help at Discharge: Family;Available PRN/intermittently (just the mother would be able to provide assist) Type of Home: House Home Access: Stairs to enter   [employer] of Steps: 1+1+1 (to get on porch, in home, then down kitchen)   Home Layout: One level Home Equipment: Cane - single point Additional Comments: pt sleeps on the floor, no access to bed    Prior Function Prior Level of Function : Needs assist             Mobility Comments: ambulates no AD, occassionally may hold on to mother for support ADLs Comments: mother assists with bathing and dressing     Hand Dominance   Dominant Hand: Right    Extremity/Trunk Assessment   Upper Extremity Assessment Upper Extremity Assessment: Defer to OT evaluation    Lower Extremity Assessment Lower Extremity Assessment: Generalized weakness (no focal numbness or weakness. pt limited in seated hip flexion, likely due to body habitus)    Cervical / Trunk Assessment Cervical / Trunk Assessment: Back Surgery;Other exceptions Cervical / Trunk Exceptions: large body habitus  Communication   Communication: No difficulties  Cognition Arousal/Alertness: Awake/alert Behavior During Therapy: WFL for tasks assessed/performed Overall Cognitive Status: Within Functional Limits for tasks assessed                                 General Comments: pt slow to respond but did answer  appropriately.        General Comments General comments (skin integrity, edema, etc.): VSS on RA        Assessment/Plan    PT Assessment Patient needs continued PT services  PT Problem List Decreased strength;Decreased activity tolerance;Decreased balance;Decreased mobility       PT Treatment Interventions Gait training;DME instruction;Stair training;Functional mobility training;Therapeutic activities;Therapeutic exercise;Neuromuscular re-education;Balance training;Patient/family education    PT Goals (Current goals can be found in the Care Plan section)  Acute Rehab PT Goals Patient Stated Goal: improve walking PT Goal Formulation: With patient Time For Goal Achievement: 02/06/23 Potential to Achieve Goals: Good    Frequency Min 5X/week        AM-PAC PT "6 Clicks" Mobility  Outcome Measure Help needed turning from your back to your side while in a flat bed without using bedrails?: A Little Help needed moving from lying on your back to sitting on the side of a flat bed without using bedrails?: A Little Help needed moving to and from a bed to a chair (including a wheelchair)?: A Little Help needed standing up from a chair using your arms (e.g., wheelchair or bedside chair)?: A Little Help needed to walk in hospital room?: A Little Help needed climbing 3-5 steps with a railing? : A Lot  6 Click Score: 17    End of Session Equipment Utilized During Treatment: Gait belt Activity Tolerance: Patient tolerated treatment well Patient left: in chair;with call bell/phone within reach;with family/visitor present Nurse Communication: Mobility status PT Visit Diagnosis: Unsteadiness on feet (R26.81);Muscle weakness (generalized) (M62.81)    Time: 4098-1191 PT Time Calculation (min) (ACUTE ONLY): 31 min   Charges:   PT Evaluation $PT Eval Low Complexity: 1 Low PT Treatments $Gait Training: 8-22 mins       Vickki Muff, PT, DPT   Acute Rehabilitation Department Office  [phone] Secure Chat Communication Preferred  Ronnie Derby 01/23/2023, 10:37 AM

## 2023-01-23 NOTE — Progress Notes (Signed)
Patient ID: Jasmine Hickman, female   DOB: 1991-07-10, 32 y.o.   MRN: [id number] BP 133/69 (BP Location: Right Arm)   Pulse 90   Temp 98.8 F (37.1 C) (Oral)   Resp 15   Ht 5\' 8"  (1.727 m)   Wt (!) 150.6 kg   LMP 01/13/2023   SpO2 95%   BMI 50.48 kg/m  Alert and oriented x 4, speech is clear, fluent Moving lower extremities well Wound is clean, dry, without signs of infection Possible discharge tomorrow. Bed situation has been resolved
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
## 2023-01-23 NOTE — Evaluation (Signed)
Occupational Therapy Evaluation Patient Details Name: Jasmine Hickman MRN: [id number] DOB: 06-Jan-1991 Today's Date: 01/23/2023   History of Present Illness The pt is a a 32 yo female presenting 5/29 for microdiscectomy of R L4-5 due to chronic back pain. PMH includes: morbid obesity, asthma, OSA, DVT, PE, arthritis, hypothyroidism, and prior microdiscectomy of L5-S1.   Clinical Impression   Pt admitted for above dx, PTA patient lived with mother and grandparents, her home setup is challenging. She reports having assist with bathing and dressing at baseline and ambulated mostly no AD but has occasional assist from mother. Pt currently tolerating back pain well with functional mobility, educated pt on POB precautions with functional mobility and transfers. Pt demonstrated understanding and successfully complete log rolls and transfers with min guard assist. However pt needs the use of bed rails and grab bars to complete transfers. Pt can recall 3/3 precautions, discussed the benefits of AE and tub bench to increase independence and reduce care giver burden of care, pt wanting to think over decision and considers sponge bathing until recovering. Pt would benefit from continued acute skilled OT to address above deficits and help transition to next level of care. Patient would benefit from post acute Home OT services to help maximize functional independence in natural environment      Recommendations for follow up therapy are one component of a multi-disciplinary discharge planning process, led by the attending physician.  Recommendations may be updated based on patient status, additional functional criteria and insurance authorization.   Assistance Recommended at Discharge Intermittent Supervision/Assistance  Patient can return home with the following A lot of help with bathing/dressing/bathroom;Assistance with cooking/housework;Assist for transportation    Functional Status Assessment  Patient has had  a recent decline in their functional status and demonstrates the ability to make significant improvements in function in a reasonable and predictable amount of time.  Equipment Recommendations  BSC/3in1;Other (comment) (AE and/or tub bench pending patient desires to sponge bathe and have mom assist with bADLs)    Recommendations for Other Services       Precautions / Restrictions Precautions Precautions: Back Precaution Booklet Issued: Yes (comment) Restrictions Weight Bearing Restrictions: No      Mobility Bed Mobility Overal bed mobility: Needs Assistance Bed Mobility: Supine to Sit, Sit to Supine     Supine to sit: Min guard Sit to supine: Min guard   General bed mobility comments: Educate pt on the use of log rolling and Pt using bed rails to assist with bed transitions    Transfers Overall transfer level: Needs assistance Equipment used: Rolling walker (2 wheels) Transfers: Sit to/from Stand Sit to Stand: Min guard           General transfer comment: STS from toilet Min guard, pt needing the aid of bed rails and grab bars to complete stand      Balance Overall balance assessment: Needs assistance Sitting-balance support: Feet supported Sitting balance-Leahy Scale: Fair     Standing balance support: Bilateral upper extremity supported, During functional activity Standing balance-Leahy Scale: Poor                             ADL either performed or assessed with clinical judgement   ADL Overall ADL's : Needs assistance/impaired Eating/Feeding: Independent;Sitting   Grooming: Standing;Min guard   Upper Body Bathing: Independent;Sitting   Lower Body Bathing: Sitting/lateral leans;Moderate assistance Lower Body Bathing Details (indicate cue type and reason): has assist at  baseline Upper Body Dressing : Independent;Sitting   Lower Body Dressing: Sitting/lateral leans;Maximal assistance Lower Body Dressing Details (indicate cue type and  reason): has assist at baseline Toilet Transfer: Ambulation;Rolling walker (2 wheels);Min guard   Toileting- Clothing Manipulation and Hygiene: Sitting/lateral lean;Min guard   Tub/ Engineer, structural: Moderate assistance   Functional mobility during ADLs: Min guard;Rolling walker (2 wheels)       Vision         Perception     Praxis      Pertinent Vitals/Pain Pain Assessment Pain Assessment: 0-10 Pain Score: 6  Pain Location: back Pain Descriptors / Indicators: Aching, Grimacing, Sore Pain Intervention(s): Limited activity within patient's tolerance, Monitored during session     Hand Dominance Right   Extremity/Trunk Assessment Upper Extremity Assessment Upper Extremity Assessment: Overall WFL for tasks assessed   Lower Extremity Assessment Lower Extremity Assessment: Defer to PT evaluation   Cervical / Trunk Assessment Cervical / Trunk Assessment: Back Surgery   Communication Communication Communication: No difficulties   Cognition Arousal/Alertness: Awake/alert Behavior During Therapy: WFL for tasks assessed/performed Overall Cognitive Status: Within Functional Limits for tasks assessed                                       General Comments  VSS on RA    Exercises     Shoulder Instructions      Home Living Family/patient expects to be discharged to:: Private residence Living Arrangements: Parent;Other relatives (mother and grandparents) Available Help at Discharge: Family;Available PRN/intermittently (just the mother would be able to provide assist) Type of Home: House Home Access: Stairs to enter [employer] of Steps: 1+1+1 (to get on porch, in home, then down kitchen)   Home Layout: One level     Bathroom Shower/Tub: Chief Strategy Officer: Standard Bathroom Accessibility: Yes How Accessible: Accessible via walker Home Equipment: Cane - single point          Prior Functioning/Environment Prior Level  of Function : Needs assist             Mobility Comments: ambulates no AD, occassionally may hold on to mother for support ADLs Comments: mother assists with bathing and dressing        OT Problem List: Pain;Obesity;Impaired balance (sitting and/or standing)      OT Treatment/Interventions: DME and/or AE instruction;Self-care/ADL training;Energy conservation;Therapeutic activities;Patient/family education;Balance training    OT Goals(Current goals can be found in the care plan section) Acute Rehab OT Goals Patient Stated Goal: To get better OT Goal Formulation: With patient/family Time For Goal Achievement: 02/06/23 Potential to Achieve Goals: Good ADL Goals Pt Will Perform Grooming: standing;with supervision Pt Will Perform Lower Body Bathing: sitting/lateral leans;with adaptive equipment;Independently Pt Will Perform Lower Body Dressing: sitting/lateral leans;with set-up;with supervision Pt Will Perform Tub/Shower Transfer: tub bench;ambulating;with supervision  OT Frequency: Min 2X/week    Co-evaluation              AM-PAC OT "6 Clicks" Daily Activity     Outcome Measure Help from another person eating meals?: None Help from another person taking care of personal grooming?: A Little Help from another person toileting, which includes using toliet, bedpan, or urinal?: A Little Help from another person bathing (including washing, rinsing, drying)?: A Lot Help from another person to put on and taking off regular upper body clothing?: None Help from another person to put on and taking  off regular lower body clothing?: A Lot 6 Click Score: 18   End of Session Equipment Utilized During Treatment: Gait belt;Rolling walker (2 wheels) Nurse Communication: Mobility status  Activity Tolerance: Patient tolerated treatment well Patient left: in bed;with call bell/phone within reach;Other (comment);with nursing/sitter in room (with PT entering pt room)  OT Visit Diagnosis:  Unsteadiness on feet (R26.81);Other abnormalities of gait and mobility (R26.89);Pain Pain - part of body:  (back)                Time: 9562-1308 OT Time Calculation (min): 39 min Charges:  OT General Charges $OT Visit: 1 Visit OT Evaluation $OT Eval Low Complexity: 1 Low OT Treatments $Self Care/Home Management : 8-22 mins $Therapeutic Activity: 8-22 mins  01/23/2023  AB, OTR/L  Acute Rehabilitation Services  Office: [phone]   Tristan Schroeder 01/23/2023, 10:23 AM

## 2023-01-24 MED ORDER — SODIUM CHLORIDE 0.9 % IV SOLN
12.5000 mg | Freq: Four times a day (QID) | INTRAVENOUS | Status: DC | PRN
  Administered 2023-01-24 – 2023-01-25 (×2): 12.5 mg via INTRAVENOUS
  Filled 2023-01-24 (×2): qty 0.5
  Filled 2023-01-24: qty 12.5
  Filled 2023-01-24: qty 0.5

## 2023-01-24 MED ORDER — MAGNESIUM CITRATE PO SOLN
1.0000 | Freq: Once | ORAL | Status: AC
  Administered 2023-01-24: 1 via ORAL
  Filled 2023-01-24: qty 296

## 2023-01-24 NOTE — TOC CM/SW Note (Cosign Needed Addendum)
Patient needs a bedside commode  due to being confined to one room in the home

## 2023-01-24 NOTE — TOC Progression Note (Addendum)
Transition of Care (TOC) - Progression Note   See previous TOC note .   Spoke to patient and her mother [name] at bedside.   Walker for home has already been delivered to room.   They are moving to [facility] Tuesday. They will establish care with a MD once they move. For home health would need a provider in [place] to follow patient.    OT recommends 3 in 1 and tub bench pending patient's desire to sponge bath and have mother assist. Discuss with patient and mother . They would like the 3 in 1 not the tub bench.   Entered order for 3 in1 called Mitch and Jason with [employer]. Will need MD signature and MD progress note stating patient is confined to one room at home without a bathroom.   Called Dr Sueanne Margarita office regarding above , spoke to Bynum awaiting call back.   Dr Franky Macho aware of above , order and note for 3 in 1 entered for signature   [address] [employer] just called 3 n 1 order and  note not signed yet, they will keep checking  [employer] aware  Patient Details  Name: Jasmine Hickman MRN: [id number] Date of Birth: 1991/04/15  Transition of Care [facility] CM/SW Contact  Kingsley Plan, RN Phone Number: 01/24/2023, 10:15 AM  Clinical Narrative:       Expected Discharge Plan: Home/Self Care Barriers to Discharge: Continued Medical Work up  Expected Discharge Plan and Services In-house Referral: Clinical Social Work Discharge Planning Services: CM Consult Post Acute Care Choice: Home Health Living arrangements for the past 2 months: Single Family Home                 DME Arranged: Walker rolling DME Agency: [employer] Date DME Agency Contacted: 01/24/23 Time DME Agency Contacted: 1006 Representative spoke with at DME Agency: mitch HH Arranged: NA           Social Determinants of Health (SDOH) Interventions SDOH Screenings   Food Insecurity: No Food Insecurity (01/22/2023)  Housing: Low Risk  (01/22/2023)  Transportation Needs: No Transportation Needs (01/22/2023)   Utilities: Not At Risk (01/22/2023)  Tobacco Use: Low Risk  (01/23/2023)    Readmission Risk Interventions     No data to display

## 2023-01-24 NOTE — Progress Notes (Signed)
Physical Therapy Treatment Patient Details Name: Jasmine Hickman MRN: [id number] DOB: 10-Jan-1991 Today's Date: 01/24/2023   History of Present Illness The pt is a a 32 yo female presenting 5/29 for microdiscectomy of R L4-5 due to chronic back pain. PMH includes: morbid obesity, autism, asthma, OSA, DVT, PE, arthritis, hypothyroidism, and prior microdiscectomy of L5-S1.    PT Comments    Good progress today, increasing ambulatory distance. Still lightly using RW for support, but likely more for confidence at this time. Reports LE pain has resolved. No buckling noted. Gait up to 100 feet at supervision level. Mother present and supportive. Family attempting to arrange having a couch placed in their home until they move to [place] (Tuesday.) She has been sleeping on an air mattress on the floor until it recently popped. They do not think a hospital bed will fit. Do not feel it would be appropriate for pt to sleep on floor if they can arrange for a couch to be delivered to the house. Patient will continue to benefit from skilled physical therapy services to further improve independence with functional mobility.    Recommendations for follow up therapy are one component of a multi-disciplinary discharge planning process, led by the attending physician.  Recommendations may be updated based on patient status, additional functional criteria and insurance authorization.  Follow Up Recommendations       Assistance Recommended at Discharge Frequent or constant Supervision/Assistance  Patient can return home with the following A little help with walking and/or transfers;Assistance with cooking/housework;Assist for transportation;Help with stairs or ramp for entrance;Direct supervision/assist for medications management;Direct supervision/assist for financial management;A little help with bathing/dressing/bathroom   Equipment Recommendations  Rolling walker (2 wheels)    Recommendations for Other Services        Precautions / Restrictions Precautions Precautions: Back Precaution Booklet Issued: Yes (comment) Precaution Comments: Recalls with visual cues. Restrictions Weight Bearing Restrictions: No     Mobility  Bed Mobility               General bed mobility comments: in recliner    Transfers Overall transfer level: Needs assistance Equipment used: Rolling walker (2 wheels) Transfers: Sit to/from Stand Sit to Stand: Supervision           General transfer comment: Supervision for safety, guarded when rising, no assist needed, reliant on RW for support, mostly for confidence it appears.    Ambulation/Gait Ambulation/Gait assistance: Supervision Gait Distance (Feet): 100 Feet Assistive device: Rolling walker (2 wheels) Gait Pattern/deviations: Step-through pattern Gait velocity: decreased Gait velocity interpretation: <1.8 ft/sec, indicate of risk for recurrent falls   General Gait Details: Slower and guarded gait without evidence of LOB or buckling. Maintains back precautions throughout. Safely utilizing RW for support, height adjusted for her personal RW.   Stairs             Wheelchair Mobility    Modified Rankin (Stroke Patients Only)       Balance Overall balance assessment: Needs assistance Sitting-balance support: Feet supported Sitting balance-Leahy Scale: Good     Standing balance support: Bilateral upper extremity supported, During functional activity Standing balance-Leahy Scale: Poor Standing balance comment: More for confidence                            Cognition Arousal/Alertness: Awake/alert Behavior During Therapy: WFL for tasks assessed/performed Overall Cognitive Status: Within Functional Limits for tasks assessed  Exercises General Exercises - Lower Extremity Ankle Circles/Pumps: AROM, Both, 10 reps, Seated Long Arc Quad: Strengthening, Both, 5 reps,  Seated Hip ABduction/ADduction: Strengthening, Both, 5 reps, Seated    General Comments General comments (skin integrity, edema, etc.): Complains of Rt itchy eye      Pertinent Vitals/Pain Pain Assessment Pain Assessment: 0-10 Pain Score: 6  Pain Location: back Pain Descriptors / Indicators: Aching, Sore Pain Intervention(s): Monitored during session    Home Living                          Prior Function            PT Goals (current goals can now be found in the care plan section) Acute Rehab PT Goals Patient Stated Goal: improve walking PT Goal Formulation: With patient Time For Goal Achievement: 02/06/23 Potential to Achieve Goals: Good Progress towards PT goals: Progressing toward goals    Frequency    Min 5X/week      PT Plan Current plan remains appropriate    Co-evaluation              AM-PAC PT "6 Clicks" Mobility   Outcome Measure  Help needed turning from your back to your side while in a flat bed without using bedrails?: A Little Help needed moving from lying on your back to sitting on the side of a flat bed without using bedrails?: A Little Help needed moving to and from a bed to a chair (including a wheelchair)?: A Little Help needed standing up from a chair using your arms (e.g., wheelchair or bedside chair)?: A Little Help needed to walk in hospital room?: A Little Help needed climbing 3-5 steps with a railing? : A Little 6 Click Score: 18    End of Session Equipment Utilized During Treatment: Gait belt Activity Tolerance: Patient tolerated treatment well Patient left: in chair;with call bell/phone within reach;with family/visitor present   PT Visit Diagnosis: Unsteadiness on feet (R26.81);Muscle weakness (generalized) (M62.81)     Time: 1610-9604 PT Time Calculation (min) (ACUTE ONLY): 17 min  Charges:  $Gait Training: 8-22 mins                     Kathlyn Sacramento, PT, DPT Physical Therapist Acute Rehabilitation  Services [facility] [phone]    Berton Mount 01/24/2023, 1:51 PM

## 2023-01-24 NOTE — Progress Notes (Signed)
Occupational Therapy Treatment Patient Details Name: Jasmine Hickman MRN: [id number] DOB: [birth year] Today's Date: 01/24/2023   History of present illness The pt is a a 32 yo female presenting 5/29 for microdiscectomy of R L4-5 due to chronic back pain. PMH includes: morbid obesity, autism, asthma, OSA, DVT, PE, arthritis, hypothyroidism, and prior microdiscectomy of L5-S1.   OT comments  Pt continuing to progress in OT sessions, verbally recalls 3/3 POB precautions. Educated pt on the use of hip kit to complete lower body dressing and bathing. Pt return demonstrated understanding and successfully use pieces of hip kit to doff/don socks. RN aware of pt having itching R eye with some inflammation, mobility deferred in the event it would be pink eye. OT to continue to progress pt as able, DC plans change to no follow-up OT post acute.    Recommendations for follow up therapy are one component of a multi-disciplinary discharge planning process, led by the attending physician.  Recommendations may be updated based on patient status, additional functional criteria and insurance authorization.    Assistance Recommended at Discharge Intermittent Supervision/Assistance  Patient can return home with the following  Assistance with cooking/housework;Assist for transportation   Equipment Recommendations  BSC/3in1    Recommendations for Other Services      Precautions / Restrictions Precautions Precautions: Back Precaution Booklet Issued: Yes (comment) Precaution Comments: Recalls with visual cues. Restrictions Weight Bearing Restrictions: No       Mobility Bed Mobility Overal bed mobility: Needs Assistance Bed Mobility: Rolling, Sidelying to Sit Rolling: Min guard Sidelying to sit: Min assist       General bed mobility comments: Pt mother providing Min A    Transfers                   General transfer comment: defer     Balance Overall balance assessment: Needs  assistance Sitting-balance support: Feet supported Sitting balance-Leahy Scale: Good         Standing balance comment: NT                           ADL either performed or assessed with clinical judgement   ADL               Lower Body Bathing: Sitting/lateral leans;Set up;Supervison/ safety;With adaptive equipment       Lower Body Dressing: Sitting/lateral leans;Set up;Supervision/safety;With adaptive equipment                 General ADL Comments: Focued session on AE education    Extremity/Trunk Assessment              Vision       Perception     Praxis      Cognition Arousal/Alertness: Awake/alert Behavior During Therapy: WFL for tasks assessed/performed Overall Cognitive Status: Within Functional Limits for tasks assessed                                          Exercises      Shoulder Instructions       General Comments Complains of Rt itchy eye    Pertinent Vitals/ Pain       Pain Assessment Pain Assessment: No/denies pain Pain Location: Pt noted to have redness and some inflammation of R eye, RN notified and contacting physician for plan of action  Home Living  Prior Functioning/Environment              Frequency  Min 2X/week        Progress Toward Goals  OT Goals(current goals can now be found in the care plan section)  Progress towards OT goals: Progressing toward goals  Acute Rehab OT Goals Patient Stated Goal: to get better OT Goal Formulation: With patient/family Time For Goal Achievement: 02/06/23 Potential to Achieve Goals: Good  Plan Frequency remains appropriate;Discharge plan needs to be updated    Co-evaluation                 AM-PAC OT "6 Clicks" Daily Activity     Outcome Measure   Help from another person eating meals?: None Help from another person taking care of personal grooming?: A Little Help  from another person toileting, which includes using toliet, bedpan, or urinal?: A Little Help from another person bathing (including washing, rinsing, drying)?: A Little (with AE) Help from another person to put on and taking off regular upper body clothing?: None Help from another person to put on and taking off regular lower body clothing?: A Little (with AE) 6 Click Score: 20    End of Session    OT Visit Diagnosis: Unsteadiness on feet (R26.81);Other abnormalities of gait and mobility (R26.89);Pain   Activity Tolerance Patient tolerated treatment well   Patient Left in bed;with call bell/phone within reach   Nurse Communication Mobility status        Time: 1530-1600 OT Time Calculation (min): 30 min  Charges: OT General Charges $OT Visit: 1 Visit OT Treatments $Self Care/Home Management : 23-37 mins  01/24/2023  AB, OTR/L  Acute Rehabilitation Services  Office: [phone]   Tristan Schroeder 01/24/2023, 4:41 PM

## 2023-01-24 NOTE — Progress Notes (Signed)
Patient ID: Jasmine Hickman, female   DOB: Nov 19, 1990, 32 y.o.   MRN: [id number] BP 108/66 (BP Location: Left Arm)   Pulse 97   Temp 98.2 F (36.8 C)   Resp 18   Ht 5\' 8"  (1.727 m)   Wt (!) 150.6 kg   LMP 01/13/2023   SpO2 95%   BMI 50.48 kg/m  Has had GI distress today.  Constipated, nauseated with emesis Normal strength in the lower extremities Given MgCitrate

## 2023-01-25 DIAGNOSIS — K219 Gastro-esophageal reflux disease without esophagitis: Secondary | ICD-10-CM | POA: Diagnosis present

## 2023-01-25 DIAGNOSIS — K5900 Constipation, unspecified: Secondary | ICD-10-CM | POA: Diagnosis not present

## 2023-01-25 DIAGNOSIS — M5126 Other intervertebral disc displacement, lumbar region: Secondary | ICD-10-CM | POA: Diagnosis present

## 2023-01-25 DIAGNOSIS — Z79890 Hormone replacement therapy (postmenopausal): Secondary | ICD-10-CM | POA: Diagnosis not present

## 2023-01-25 DIAGNOSIS — F32A Depression, unspecified: Secondary | ICD-10-CM | POA: Diagnosis present

## 2023-01-25 DIAGNOSIS — Z91012 Allergy to eggs: Secondary | ICD-10-CM | POA: Diagnosis not present

## 2023-01-25 DIAGNOSIS — Z888 Allergy status to other drugs, medicaments and biological substances status: Secondary | ICD-10-CM | POA: Diagnosis not present

## 2023-01-25 DIAGNOSIS — Z91011 Allergy to milk products: Secondary | ICD-10-CM | POA: Diagnosis not present

## 2023-01-25 DIAGNOSIS — G4730 Sleep apnea, unspecified: Secondary | ICD-10-CM | POA: Diagnosis present

## 2023-01-25 DIAGNOSIS — Z7951 Long term (current) use of inhaled steroids: Secondary | ICD-10-CM | POA: Diagnosis not present

## 2023-01-25 DIAGNOSIS — Z91018 Allergy to other foods: Secondary | ICD-10-CM | POA: Diagnosis not present

## 2023-01-25 DIAGNOSIS — Z91030 Bee allergy status: Secondary | ICD-10-CM | POA: Diagnosis not present

## 2023-01-25 DIAGNOSIS — F840 Autistic disorder: Secondary | ICD-10-CM | POA: Diagnosis present

## 2023-01-25 DIAGNOSIS — Z6841 Body Mass Index (BMI) 40.0 and over, adult: Secondary | ICD-10-CM | POA: Diagnosis not present

## 2023-01-25 DIAGNOSIS — J45909 Unspecified asthma, uncomplicated: Secondary | ICD-10-CM | POA: Diagnosis present

## 2023-01-25 DIAGNOSIS — E039 Hypothyroidism, unspecified: Secondary | ICD-10-CM | POA: Diagnosis present

## 2023-01-25 DIAGNOSIS — Z79899 Other long term (current) drug therapy: Secondary | ICD-10-CM | POA: Diagnosis not present

## 2023-01-25 NOTE — Plan of Care (Signed)

## 2023-01-25 NOTE — Progress Notes (Signed)
Patient ID: Jasmine Hickman, female   DOB: 04-05-1991, 32 y.o.   MRN: [id number] BP 118/64 (BP Location: Right Arm)   Pulse 88   Temp 98.9 F (37.2 C) (Oral)   Resp 16   Ht 5\' 8"  (1.727 m)   Wt (!) 150.6 kg   LMP 01/13/2023   SpO2 100%   BMI 50.48 kg/m  Alert and oriented Wound is clean, no signs of infection Has had multiple bowel movements, remains with abdominal pain No emesis today Moving well

## 2023-01-25 NOTE — Anesthesia Postprocedure Evaluation (Signed)
Anesthesia Post Note  Patient: Jasmine Hickman  Procedure(s) Performed: Right Lumbar Four-Five Microdiscectomy (Right: Spine Lumbar)     Patient location during evaluation: PACU Anesthesia Type: General Level of consciousness: awake and alert Pain management: pain level controlled Vital Signs Assessment: post-procedure vital signs reviewed and stable Respiratory status: spontaneous breathing, nonlabored ventilation, respiratory function stable and patient connected to nasal cannula oxygen Cardiovascular status: blood pressure returned to baseline and stable Postop Assessment: no apparent nausea or vomiting Anesthetic complications: no   There were no known notable events for this encounter.  Last Vitals:  Vitals:   01/25/23 0828 01/25/23 1028  BP:  138/85  Pulse:  74  Resp:  18  Temp:  36.8 C  SpO2: 98% 100%    Last Pain:  Vitals:   01/25/23 0900  TempSrc:   PainSc: 0-No pain                 Rian Busche

## 2023-01-25 NOTE — Progress Notes (Signed)
PT Cancellation Note  Patient Details Name: Jasmine Hickman MRN: [id number] DOB: 1990-10-21   Cancelled Treatment:    Reason Eval/Treat Not Completed: (P) Patient declined, no reason specified (Attempted to see pt twice today and pt and pt's mother reporting that she is feeling nauseous and requests that PT returns tomorrow.)   Johny Shock 01/25/2023, 3:45 PM

## 2023-01-26 NOTE — Progress Notes (Signed)
Patient ID: Jasmine Hickman, female   DOB: 05-30-1991, 32 y.o.   MRN: [id number] BP 121/68 (BP Location: Right Arm)   Pulse 75   Temp 98.4 F (36.9 C) (Oral)   Resp 17   Ht 5\' 8"  (1.727 m)   Wt (!) 150.6 kg   LMP 01/13/2023   SpO2 99%   BMI 50.48 kg/m  Alert and oriented Moving all extremities Wound is clean, and dry Continue with pt/ot

## 2023-01-27 NOTE — Progress Notes (Signed)
PT Cancellation Note  Patient Details Name: Jasmine Hickman MRN: [id number] DOB: 1991/03/28   Cancelled Treatment:    Reason Eval/Treat Not Completed: Other (comment)  Requested PT return later as she is just waking up and too tired.  Will stop by again this afternoon to work with patient. Plan for stair training.  Berton Mount 01/27/2023, 10:14 AM

## 2023-01-27 NOTE — Plan of Care (Signed)
  Problem: Activity: Goal: Risk for activity intolerance will decrease Outcome: Progressing   Problem: Nutrition: Goal: Adequate nutrition will be maintained Outcome: Progressing   Problem: Coping: Goal: Level of anxiety will decrease Outcome: Progressing   

## 2023-01-27 NOTE — Progress Notes (Signed)
Physical Therapy Treatment Patient Details Name: Jasmine Hickman MRN: [id number] DOB: 1990-11-07 Today's Date: 01/27/2023   History of Present Illness The pt is a a 32 yo female presenting 5/29 for microdiscectomy of R L4-5 due to chronic back pain. PMH includes: morbid obesity, autism, asthma, OSA, DVT, PE, arthritis, hypothyroidism, and prior microdiscectomy of L5-S1.    PT Comments    Excellent progress, ambulating in hallway >100 feet without assistive device at supervision level for safety, no buckling or overt LOB noted. Safely completed stair training. Verbalized understanding of precautions and safely maintains throughout session. Patient will continue to benefit from skilled physical therapy services to further improve independence with functional mobility.    Recommendations for follow up therapy are one component of a multi-disciplinary discharge planning process, led by the attending physician.  Recommendations may be updated based on patient status, additional functional criteria and insurance authorization.  Follow Up Recommendations       Assistance Recommended at Discharge Frequent or constant Supervision/Assistance  Patient can return home with the following A little help with walking and/or transfers;Assistance with cooking/housework;Assist for transportation;Help with stairs or ramp for entrance;Direct supervision/assist for medications management;Direct supervision/assist for financial management;A little help with bathing/dressing/bathroom   Equipment Recommendations  Rolling walker (2 wheels)    Recommendations for Other Services       Precautions / Restrictions Precautions Precautions: Back Precaution Booklet Issued: Yes (comment) Precaution Comments: Recalls with extra time Restrictions Weight Bearing Restrictions: No     Mobility  Bed Mobility Overal bed mobility: Needs Assistance Bed Mobility: Rolling, Sidelying to Sit Rolling: Supervision Sidelying to  sit: Supervision       General bed mobility comments: No assist required, supervision for safety. Maintains precautions    Transfers Overall transfer level: Needs assistance Equipment used: None Transfers: Sit to/from Stand Sit to Stand: Supervision           General transfer comment: Supervision for safety. Slower to rise but without physical assistance. Performed from bed, and mod I from toilet.    Ambulation/Gait Ambulation/Gait assistance: Supervision Gait Distance (Feet): 145 Feet Assistive device: None Gait Pattern/deviations: Step-through pattern, Wide base of support Gait velocity: decreased Gait velocity interpretation: <1.8 ft/sec, indicate of risk for recurrent falls   General Gait Details: Slightly guarded with mild sway likely due to body habitus rather than neural component. No buckling noted, good foot clearance. Tolerated increased distance today, no assistive device. Declines further distance.   Stairs Stairs: Yes Stairs assistance: Supervision Stair Management: No rails, Step to pattern, Forwards Number of Stairs: 1 (x2) General stair comments: Completed stair training similar to described home set up without rails. Safely performed without physical intervention. Hesitant but stable.   Wheelchair Mobility    Modified Rankin (Stroke Patients Only)       Balance Overall balance assessment: Needs assistance Sitting-balance support: Feet supported Sitting balance-Leahy Scale: Good     Standing balance support: During functional activity, No upper extremity supported Standing balance-Leahy Scale: Good                              Cognition Arousal/Alertness: Awake/alert Behavior During Therapy: WFL for tasks assessed/performed Overall Cognitive Status: Within Functional Limits for tasks assessed  Exercises General Exercises - Lower Extremity Ankle Circles/Pumps: AROM,  Both, 10 reps, Seated Gluteal Sets: Strengthening, Both, 5 reps, Seated Long Arc Quad: Strengthening, Both, 5 reps, Seated    General Comments        Pertinent Vitals/Pain Pain Assessment Pain Assessment: No/denies pain Pain Intervention(s): Monitored during session    Home Living                          Prior Function            PT Goals (current goals can now be found in the care plan section) Acute Rehab PT Goals Patient Stated Goal: improve walking PT Goal Formulation: With patient Time For Goal Achievement: 02/06/23 Potential to Achieve Goals: Good Progress towards PT goals: Progressing toward goals    Frequency    Min 5X/week      PT Plan Current plan remains appropriate    Co-evaluation              AM-PAC PT "6 Clicks" Mobility   Outcome Measure  Help needed turning from your back to your side while in a flat bed without using bedrails?: A Little Help needed moving from lying on your back to sitting on the side of a flat bed without using bedrails?: A Little Help needed moving to and from a bed to a chair (including a wheelchair)?: A Little Help needed standing up from a chair using your arms (e.g., wheelchair or bedside chair)?: A Little Help needed to walk in hospital room?: A Little Help needed climbing 3-5 steps with a railing? : A Little 6 Click Score: 18    End of Session Equipment Utilized During Treatment: Gait belt Activity Tolerance: Patient tolerated treatment well Patient left: in chair;with call bell/phone within reach;with family/visitor present Nurse Communication: Mobility status PT Visit Diagnosis: Unsteadiness on feet (R26.81);Muscle weakness (generalized) (M62.81)     Time: 1610-9604 PT Time Calculation (min) (ACUTE ONLY): 21 min  Charges:  $Gait Training: 8-22 mins                     Kathlyn Sacramento, PT, DPT Physical Therapist Acute Rehabilitation Services [facility] [phone]    Berton Mount 01/27/2023, 1:14 PM

## 2023-01-27 NOTE — Progress Notes (Signed)
Patient ID: Jasmine Hickman, female   DOB: 1991-03-19, 32 y.o.   MRN: [id number] BP 128/65 (BP Location: Right Arm)   Pulse 78   Temp 99.1 F (37.3 C) (Oral)   Resp 16   Ht 5\' 8"  (1.727 m)   Wt (!) 150.6 kg   LMP 01/13/2023   SpO2 100%   BMI 50.48 kg/m  Alert and oriented x 4, speech is clear and fluent Possible discharge tomorrow Has no where to go, Grandmother is denying her access to her home.  Thus I have nowhere to discharge her to.  Moving all extremities

## 2023-01-28 NOTE — Progress Notes (Signed)
Physical Therapy Treatment Patient Details Name: Jasmine Hickman MRN: [id number] DOB: 1991-03-26 Today's Date: 01/28/2023   History of Present Illness The pt is a a 32 yo female presenting 5/29 for microdiscectomy of R L4-5 due to chronic back pain. PMH includes: morbid obesity, autism, asthma, OSA, DVT, PE, arthritis, hypothyroidism, and prior microdiscectomy of L5-S1.    PT Comments    Excellent progress. Eager to work with therapy today. Has progressed to mod I level without assistive device. Ambulating 175 feet without evidence of LOB or buckling, and denies pain. Challenged with dynamic tasks and tolerated well. Reviewed HEP which she recalls nicely, as well as precautions and maintains well throughout. Will continue to follow and progress until d/c.   Recommendations for follow up therapy are one component of a multi-disciplinary discharge planning process, led by the attending physician.  Recommendations may be updated based on patient status, additional functional criteria and insurance authorization.  Follow Up Recommendations       Assistance Recommended at Discharge PRN  Patient can return home with the following A little help with bathing/dressing/bathroom;Assist for transportation;Help with stairs or ramp for entrance   Equipment Recommendations  None recommended by PT    Recommendations for Other Services       Precautions / Restrictions Precautions Precautions: Back Precaution Comments: able to recall precautions 3/3 Restrictions Weight Bearing Restrictions: No     Mobility  Bed Mobility Overal bed mobility: Needs Assistance             General bed mobility comments: Pt in chair    Transfers Overall transfer level: Needs assistance Equipment used: None Transfers: Sit to/from Stand Sit to Stand: Modified independent (Device/Increase time)           General transfer comment: Performed at mod I level from recliner and toilet. maintains precautions. no  AD today.    Ambulation/Gait Ambulation/Gait assistance: Modified independent (Device/Increase time) Gait Distance (Feet): 175 Feet Assistive device: None Gait Pattern/deviations: WFL(Within Functional Limits), Wide base of support Gait velocity: decreased Gait velocity interpretation: 1.31 - 2.62 ft/sec, indicative of limited community ambulator   General Gait Details: Good functional gait with wide BOS 2/2 body habitus. Challenged dynamically with backwards and lateral steps today. Pivots and variable speeds without incidence. No AD utilized. Safe with tasks, increased distance toelrated.   Stairs         General stair comments: Declined, feels confident   Wheelchair Mobility    Modified Rankin (Stroke Patients Only)       Balance Overall balance assessment: Needs assistance Sitting-balance support: Feet supported Sitting balance-Leahy Scale: Normal Sitting balance - Comments: able to perform ADLs at EOB   Standing balance support: No upper extremity supported Standing balance-Leahy Scale: Good Standing balance comment: unsupported                            Cognition Arousal/Alertness: Awake/alert Behavior During Therapy: WFL for tasks assessed/performed Overall Cognitive Status: Within Functional Limits for tasks assessed                                          Exercises Other Exercises Other Exercises: Recalls exercises including ankle pumps, LAQ, hip adduction, and glute squeezes.    General Comments General comments (skin integrity, edema, etc.): Recalls 3/3 precautions, encouraged frequent short distance ambulatory bouts during the day.  frequent change in position, avoid prolonged sitting or lying.      Pertinent Vitals/Pain Pain Assessment Pain Assessment: No/denies pain    Home Living                          Prior Function            PT Goals (current goals can now be found in the care plan section)  Acute Rehab PT Goals Patient Stated Goal: improve walking PT Goal Formulation: With patient Time For Goal Achievement: 02/06/23 Potential to Achieve Goals: Good Progress towards PT goals: Progressing toward goals    Frequency    Min 5X/week      PT Plan Discharge plan needs to be updated    Co-evaluation              AM-PAC PT "6 Clicks" Mobility   Outcome Measure  Help needed turning from your back to your side while in a flat bed without using bedrails?: A Little Help needed moving from lying on your back to sitting on the side of a flat bed without using bedrails?: A Little Help needed moving to and from a bed to a chair (including a wheelchair)?: None Help needed standing up from a chair using your arms (e.g., wheelchair or bedside chair)?: None Help needed to walk in hospital room?: None Help needed climbing 3-5 steps with a railing? : A Little 6 Click Score: 21    End of Session Equipment Utilized During Treatment: Gait belt Activity Tolerance: Patient tolerated treatment well Patient left: in chair;with call bell/phone within reach;with family/visitor present Nurse Communication: Mobility status PT Visit Diagnosis: Unsteadiness on feet (R26.81);Muscle weakness (generalized) (M62.81)     Time: 1610-9604 PT Time Calculation (min) (ACUTE ONLY): 19 min  Charges:  $Therapeutic Activity: 8-22 mins                     Kathlyn Sacramento, PT, DPT [facility]  Rehabilitation Services Physical Therapist Office: [phone] Website: [email or web address]    Berton Mount 01/28/2023, 4:42 PM

## 2023-01-28 NOTE — Care Management Important Message (Signed)
Important Message  Patient Details  Name: KEYOSHA DEMOULIN MRN: [id number] Date of Birth: March 11, 1991   Medicare Important Message Given:  Yes     Sherilyn Banker 01/28/2023, 4:26 PM

## 2023-01-28 NOTE — Progress Notes (Signed)
Patient ID: Jasmine Hickman, female   DOB: 06/01/1991, 32 y.o.   MRN: [id number] Alert and oriented x 4, speech is clear and fluent Will discharge tomorrow. Able to secure a place to stay tomorrow Doing well overall BP 134/68 (BP Location: Right Arm)   Pulse 71   Temp 98.5 F (36.9 C) (Oral)   Resp 14   Ht 5\' 8"  (1.727 m)   Wt (!) 150.6 kg   LMP 01/13/2023   SpO2 100%   BMI 50.48 kg/m

## 2023-01-28 NOTE — TOC Progression Note (Addendum)
Transition of Care [facility] - Progression Note    Patient Details  Name: Jasmine Hickman MRN: [id number] Date of Birth: 1990/09/30  [facility]) CM/SW Contact  Lawerance Sabal, RN Phone Number: 01/28/2023, 11:13 AM  Clinical Narrative:     DME RW in room, Tub bench requested through [employer] as she walked well yesterday w/o assistive device.   Spoke w patient and her mother at bedside.  They state that they are going to go to stay with her paternal Grandmother Quentin Ore and grandfather Timmy Isget.   Per patient her grandmother is getting a bed to go in the living room from a neighbor for her, but wants to get it right before she comes home, and she did nit have it as of yesterday. She has one step between living room and rest of the house, and will be able to get to the bathroom.   They granted me permission to call the grandparents to expedite this process, as I explained that [name] is medically ready to DC.   I LVM on phones for both Alona Bene [phone] and Lorne Skeens [phone] requesting callback and informing them that [name] is ready to come home.  Awaiting call back.   15:23 Spoke w patient's grandmother, she states that SHE CAN TAKE THE PATIENT BACK HOME TODAY. She has a bed and is putting it together. She spoke w patient's mom's sister Audie Pinto [phone] who is agreeable to provide a ride home.   Spoke to patient's mother in the room. She became very agitated about discharging today, stating that Dr Franky Macho told them they could stay until they felt like leaving, and she has obtained a hotel room for two nights starting tomorrow night with [facility] transport through their insurance.   She wanted to speak w Dr Franky Macho personally for clearance for DC. I left a message at his office requesting him to call and speak with her, providing phone number for room.  Patient walking from bathroom in room independently during this time.   Expected Discharge Plan: Home/Self  Care Barriers to Discharge: Continued Medical Work up  Expected Discharge Plan and Services In-house Referral: Clinical Social Work Discharge Planning Services: CM Consult Post Acute Care Choice: Home Health Living arrangements for the past 2 months: Single Family Home                 DME Arranged: Tub bench DME Agency: [employer] Date DME Agency Contacted: 01/28/23 Time DME Agency Contacted: 1113 Representative spoke with at DME Agency: Vaughan Basta HH Arranged: NA           Social Determinants of Health (SDOH) Interventions SDOH Screenings   Food Insecurity: No Food Insecurity (01/22/2023)  Housing: Low Risk  (01/22/2023)  Transportation Needs: No Transportation Needs (01/22/2023)  Utilities: Not At Risk (01/22/2023)  Tobacco Use: Low Risk  (01/23/2023)    Readmission Risk Interventions     No data to display

## 2023-01-28 NOTE — Progress Notes (Signed)
Occupational Therapy Treatment/Discharge Patient Details Name: Jasmine Hickman MRN: [id number] DOB: 1991-08-12 Today's Date: 01/28/2023   History of present illness The pt is a a 32 yo female presenting 5/29 for microdiscectomy of R L4-5 due to chronic back pain. PMH includes: morbid obesity, autism, asthma, OSA, DVT, PE, arthritis, hypothyroidism, and prior microdiscectomy of L5-S1.   OT comments  Pt has no pain today, feeling good, no complaints. Mother was present during session, stated they had a hotel set up, starting tomorrow, for the next 3 days as they figure out DC plan. Pt able to recite precautions, able to use AE as needed for dressing/bathing as needed. Pt mod I for transfers, standing ADLs, able to gather items needed for dressing from closet, good safety awareness, no LOB. Pt is at baseline function, mother helped for dressing/bathing/perineal hygiene at baseline. Pt and mother feeling confident in ability to maintain functional independence and safety for DC. No further OT services necessary, no follow up needed.     Recommendations for follow up therapy are one component of a multi-disciplinary discharge planning process, led by the attending physician.  Recommendations may be updated based on patient status, additional functional criteria and insurance authorization.    Assistance Recommended at Discharge Set up Supervision/Assistance  Patient can return home with the following  Assistance with cooking/housework;Assist for transportation   Equipment Recommendations  BSC/3in1    Recommendations for Other Services      Precautions / Restrictions Precautions Precautions: Back Precaution Comments: able to recall precautions 3/3 Restrictions Weight Bearing Restrictions: No       Mobility Bed Mobility Overal bed mobility: Needs Assistance             General bed mobility comments: Pt in chair    Transfers Overall transfer level: Needs assistance Equipment used:  None Transfers: Sit to/from Stand Sit to Stand: Modified independent (Device/Increase time)           General transfer comment: supervision for safety, no LOB, good balance     Balance Overall balance assessment: Needs assistance Sitting-balance support: Feet supported Sitting balance-Leahy Scale: Normal Sitting balance - Comments: able to perform ADLs at EOB   Standing balance support: No upper extremity supported Standing balance-Leahy Scale: Good Standing balance comment: able to stand/ambulate performing ADLs unsupported                           ADL either performed or assessed with clinical judgement   ADL Overall ADL's : Needs assistance/impaired     Grooming: Standing;Modified independent           Upper Body Dressing : Independent;Sitting   Lower Body Dressing: Modified independent;Sit to/from stand Lower Body Dressing Details (indicate cue type and reason): Pt was able to don/doff shoes/socks mod I for increased time, able to simulate don/doff pants, sit to stand Toilet Transfer: Modified Independent   Toileting- Clothing Manipulation and Hygiene: Moderate assistance;With caregiver independent assisting Toileting - Clothing Manipulation Details (indicate cue type and reason): mother helps with perineal hygiene at baseline     Functional mobility during ADLs: Modified independent General ADL Comments: Pt able to perform ADLs mod I, supervision for standing/transfers for safety, no pain, no LOB, good safety awareness. Pt displays good knowledge of use of AD for dressing/bathing from prior session    Extremity/Trunk Assessment Upper Extremity Assessment Upper Extremity Assessment: Overall WFL for tasks assessed  Vision       Perception     Praxis      Cognition Arousal/Alertness: Awake/alert Behavior During Therapy: WFL for tasks assessed/performed Overall Cognitive Status: Within Functional Limits for tasks assessed                                  General Comments: Pt requires increased time to respond to some questions        Exercises      Shoulder Instructions       General Comments Recalls 3/3 precautions, encouraged frequent short distance ambulatory bouts during the day. frequent change in position, avoid prolonged sitting or lying.    Pertinent Vitals/ Pain       Pain Assessment Pain Assessment: No/denies pain  Home Living                                          Prior Functioning/Environment              Frequency  Min 2X/week        Progress Toward Goals  OT Goals(current goals can now be found in the care plan section)  Progress towards OT goals: Goals met/education completed, patient discharged from OT  Acute Rehab OT Goals Patient Stated Goal: to return home OT Goal Formulation: With patient/family Time For Goal Achievement: 02/06/23 Potential to Achieve Goals: Good ADL Goals Pt Will Perform Grooming: standing;with supervision Pt Will Perform Lower Body Bathing: sitting/lateral leans;with adaptive equipment;Independently Pt Will Perform Lower Body Dressing: sitting/lateral leans;with set-up;with supervision Pt Will Perform Tub/Shower Transfer: tub bench;ambulating;with supervision  Plan Discharge plan remains appropriate    Co-evaluation                 AM-PAC OT "6 Clicks" Daily Activity     Outcome Measure   Help from another person eating meals?: None Help from another person taking care of personal grooming?: A Little Help from another person toileting, which includes using toliet, bedpan, or urinal?: A Little Help from another person bathing (including washing, rinsing, drying)?: A Little Help from another person to put on and taking off regular upper body clothing?: None Help from another person to put on and taking off regular lower body clothing?: None 6 Click Score: 21    End of Session    OT Visit  Diagnosis: Unsteadiness on feet (R26.81);Other abnormalities of gait and mobility (R26.89);Pain   Activity Tolerance Patient tolerated treatment well   Patient Left in chair;with family/visitor present   Nurse Communication Mobility status        Time: 1610-9604 OT Time Calculation (min): 18 min  Charges: OT General Charges $OT Visit: 1 Visit OT Treatments $Self Care/Home Management : 8-22 mins  [place], OTR/L   Alexis Goodell 01/28/2023, 4:52 PM

## 2023-01-29 MED ORDER — TIZANIDINE HCL 4 MG PO TABS: 4.0000 mg | ORAL_TABLET | Freq: Four times a day (QID) | ORAL | 0 refills | Status: AC | PRN

## 2023-01-29 MED ORDER — HYDROCODONE-ACETAMINOPHEN 5-325 MG PO TABS: 1.0000 | ORAL_TABLET | ORAL | 0 refills | Status: AC | PRN

## 2023-01-29 NOTE — Progress Notes (Signed)
AVS instructions were reviewed with patient and mother at bedside.   All personal belongings were returned. All questions answered.   Transportation plan identified.

## 2023-01-29 NOTE — Discharge Summary (Signed)
Physician Discharge Summary  Patient ID: Jasmine Hickman MRN: [id number] DOB/AGE: 03-Oct-1990 32 y.o.  Admit date: 01/22/2023 Discharge date: 01/29/2023  Admission Diagnoses:hnp lumbar L4/5  Discharge Diagnoses:  Principal Problem:   HNP (herniated nucleus pulposus), lumbar   Discharged Condition: good  Hospital Course: [name] was admitted and taken to the operating room for an uncomplicated lumbar discetomy on the right at L4/5.  Treatments: surgery:  Right Lumbar Four-Five Microdiscectomy   Discharge Exam: Blood pressure 112/78, pulse 91, temperature 98 F (36.7 C), temperature source Oral, resp. rate 18, height 5\' 8"  (1.727 m), weight (!) 150.6 kg, last menstrual period 01/13/2023, SpO2 90 %. General appearance: alert, cooperative, appears stated age, and no distress  Disposition: Discharge disposition: 01-Home or Self Care      Disc displacement, Lumbar region  Allergies as of 01/29/2023       Reactions   Other Nausea And Vomiting, Swelling   PRETZELS > ANGIOEDEMA, SWELLING OF TONGUE Pt reports experienced n/v with epidural steroid injection - unknown name (allergies would not title as "unknown") Pretzel, Epidural steroid injection Pretzels causes tongue swelling Tongue swelling- Pretzels   Bee Pollen Swelling   Angioedema    Capsaicin Hives, Rash   Bee Venom    UNSPECIFIED REACTION    Egg-derived Products Other (See Comments)   Egg whites "makes the inside of my face burn"  [Can take flu shot]   Milk (cow) Other (See Comments)   "makes the inside of my face burn"         Medication List     TAKE these medications    albuterol 108 (90 Base) MCG/ACT inhaler Commonly known as: VENTOLIN HFA Inhale 1-2 puffs into the lungs every 6 (six) hours as needed for wheezing or shortness of breath.   budesonide-formoterol 80-4.5 MCG/ACT inhaler Commonly known as: SYMBICORT Inhale 2 puffs into the lungs 2 (two) times daily.   dicyclomine 10 MG  capsule Commonly known as: BENTYL Take 10 mg by mouth 4 (four) times daily -  before meals and at bedtime. 30 min   HYDROcodone-acetaminophen 5-325 MG tablet Commonly known as: NORCO/VICODIN Take 1 tablet by mouth every 4 (four) hours as needed for moderate pain ((score 4 to 6)).   hydrocortisone 2.5 % lotion Apply 1 Application topically 2 (two) times daily.   hydrOXYzine 50 MG capsule Commonly known as: VISTARIL Take 50 mg by mouth daily as needed for anxiety.   ketoconazole 2 % cream Commonly known as: NIZORAL Apply 1 Application topically daily.   levothyroxine 75 MCG tablet Commonly known as: SYNTHROID Take 75 mcg by mouth daily before breakfast.   montelukast 10 MG tablet Commonly known as: SINGULAIR Take 10 mg by mouth in the morning.   norethindrone 0.35 MG tablet Commonly known as: MICRONOR Take 1 tablet by mouth daily.   pantoprazole 40 MG tablet Commonly known as: PROTONIX Take 40 mg by mouth 2 (two) times daily.   sertraline 50 MG tablet Commonly known as: ZOLOFT Take 100 mg by mouth daily.   tiZANidine 4 MG tablet Commonly known as: ZANAFLEX Take 1 tablet (4 mg total) by mouth every 6 (six) hours as needed for muscle spasms.   Vitamin D3 1.25 MG (50000 UT) Caps Take 50,000 Units by mouth once a week.               Durable Medical Equipment  (From admission, onward)           Start     Ordered  01/24/23 1039  For home use only DME 3 n 1  Once        01/24/23 1038   01/24/23 1039  For home use only DME Bedside commode  Once       Question:  Patient needs a bedside commode to treat with the following condition  Answer:  Weakness   01/24/23 1038   01/23/23 1254  For home use only DME Walker rolling  Once       Question Answer Comment  Walker: With 5 Inch Wheels   Patient needs a walker to treat with the following condition S/P lumbar microdiscectomy      01/23/23 1255            Follow-up Information     Kathrynn Running, PA-C Follow up.   Specialty: Family Medicine Contact information: [address] [place] [address] [phone]         Coletta Memos, MD Follow up.   Specialty: Neurosurgery Why: keep your scheduled appointment Contact information: [address] [place] [address] [phone]                 Signed: Coletta Memos 01/29/2023, 4:47 PM

## 2023-01-29 NOTE — Progress Notes (Signed)
Mobility Specialist Progress Note:    01/29/23 1400  Mobility  Activity Refused mobility   Pt received in chair, nonverbal through interaction. Family member declines mobility stating their discharge soon. Will f/u.     Thompson Grayer Mobility Specialist  Please contact vis Secure Chat or  Rehab Office [phone]

## 2024-04-02 ENCOUNTER — Ambulatory Visit: Admission: EM | Admit: 2024-04-02 | Discharge: 2024-04-02 | Disposition: A | Discharge status: Home / Self Care

## 2024-04-02 ENCOUNTER — Encounter: Payer: Self-pay | Admitting: Emergency Medicine

## 2024-04-02 DIAGNOSIS — R112 Nausea with vomiting, unspecified: Secondary | ICD-10-CM | POA: Diagnosis not present

## 2024-04-02 DIAGNOSIS — K219 Gastro-esophageal reflux disease without esophagitis: Secondary | ICD-10-CM | POA: Diagnosis not present

## 2024-04-02 MED ORDER — ONDANSETRON 4 MG PO TBDP: 4.0000 mg | ORAL_TABLET | Freq: Three times a day (TID) | ORAL | 0 refills | Status: AC | PRN

## 2024-04-02 NOTE — Discharge Instructions (Signed)
 We have sent in a medication in case you have any more vomiting.  Please continue to follow-up with the GI doctor towards the end of this month as they will be a better resource for managing your symptoms.  Please go to the emergency department if you develop any significant chest pain, shortness of breath, are unable to keep down fluids for an extended period of time, have significant worsening of symptoms, or if you have any other concerns.

## 2024-04-02 NOTE — Progress Notes (Signed)
 Patient presents for continued evaluation of left heel spur syndrome ambulatory with vital signs stable in no apparent distress. Passive ranges of motion are available in the left foot and ankle. There is limitation due to edema and discomfort on attempted active examination in the left foot and ankle particularly along the bands of the medial and central plantar fascia. Point tenderness is elicited at the medial tuburcle of the calcaneus. Treatment this date continued to resolve associated inflammation in the area of the symptomatic plantar left foot secondary to heel spur syndrome.   A verbal consent for the procedure was obtained, inquiries were invited and answered. The patient was placed in the supine position where the foot was prepped utilizing alcohol. The left foot was anesthetized topically with ethyl chloride and addressed with an injection containing four milligrams dexamethasone  sodium phosphate  and five milligrams bupivacaine which was administered by Dr. Donna within the infracalcaneal bursa. Patient tolerated procedure well and left facility with vital signs stable.  Tendon Sheath Injection: left plantar fascia on 04/02/2024 10:20 AM Indications: pain Details: 27 G needle, medial approach Medications: 5 mL BUPivacaine HCl 0.5 % (5 mg/mL); 4 mg dexAMETHasone  4 mg/mL Outcome: patient tolerated the procedure well with no immediate complications Procedure, treatment alternatives, risks and benefits explained, specific risks discussed. Consent was given by the patient. Immediately prior to procedure a time out was called to verify the correct patient, procedure, equipment, support staff and site/side marked as required. Patient was prepped and draped in the usual sterile fashion.
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
## 2024-04-02 NOTE — ED Triage Notes (Signed)
 Pt states after she takes a bite of food she starts hiccupping which then causes her to throw up and makes her chest hurt. Pt states this has been going on for over 1 year.
